# Patient Record
Sex: Female | Born: 1945
Health system: Southern US, Community
[De-identification: ages and names within clinical notes are randomized; demographics above are authoritative.]

## PROBLEM LIST (undated history)

## (undated) DIAGNOSIS — I10 Essential (primary) hypertension: Secondary | ICD-10-CM

## (undated) DIAGNOSIS — E785 Hyperlipidemia, unspecified: Secondary | ICD-10-CM

## (undated) DIAGNOSIS — R609 Edema, unspecified: Secondary | ICD-10-CM

## (undated) DIAGNOSIS — K921 Melena: Secondary | ICD-10-CM

## (undated) DIAGNOSIS — K625 Hemorrhage of anus and rectum: Secondary | ICD-10-CM

## (undated) DIAGNOSIS — IMO0001 Reserved for inherently not codable concepts without codable children: Secondary | ICD-10-CM

## (undated) DIAGNOSIS — J45909 Unspecified asthma, uncomplicated: Secondary | ICD-10-CM

## (undated) DIAGNOSIS — I509 Heart failure, unspecified: Secondary | ICD-10-CM

## (undated) DIAGNOSIS — R0602 Shortness of breath: Secondary | ICD-10-CM

## (undated) DIAGNOSIS — I5032 Chronic diastolic (congestive) heart failure: Secondary | ICD-10-CM

## (undated) DIAGNOSIS — I2510 Atherosclerotic heart disease of native coronary artery without angina pectoris: Secondary | ICD-10-CM

## (undated) DIAGNOSIS — E669 Obesity, unspecified: Secondary | ICD-10-CM

## (undated) DIAGNOSIS — M1990 Unspecified osteoarthritis, unspecified site: Secondary | ICD-10-CM

## (undated) DIAGNOSIS — Z794 Long term (current) use of insulin: Secondary | ICD-10-CM

## (undated) DIAGNOSIS — J449 Chronic obstructive pulmonary disease, unspecified: Secondary | ICD-10-CM

## (undated) DIAGNOSIS — K5909 Other constipation: Secondary | ICD-10-CM

## (undated) DIAGNOSIS — K219 Gastro-esophageal reflux disease without esophagitis: Secondary | ICD-10-CM

## (undated) DIAGNOSIS — G8929 Other chronic pain: Secondary | ICD-10-CM

## (undated) DIAGNOSIS — M109 Gout, unspecified: Secondary | ICD-10-CM

## (undated) DIAGNOSIS — Z9981 Dependence on supplemental oxygen: Secondary | ICD-10-CM

## (undated) DIAGNOSIS — M545 Low back pain: Secondary | ICD-10-CM

## (undated) DIAGNOSIS — G4733 Obstructive sleep apnea (adult) (pediatric): Secondary | ICD-10-CM

## (undated) DIAGNOSIS — Z8719 Personal history of other diseases of the digestive system: Secondary | ICD-10-CM

## (undated) DIAGNOSIS — E119 Type 2 diabetes mellitus without complications: Secondary | ICD-10-CM

## (undated) HISTORY — DX: Gastro-esophageal reflux disease without esophagitis: K21.9

## (undated) HISTORY — DX: Other constipation: K59.09

## (undated) HISTORY — DX: Reserved for inherently not codable concepts without codable children: IMO0001

## (undated) HISTORY — DX: Other chronic pain: G89.29

## (undated) HISTORY — PX: CHOLECYSTECTOMY: SHX55

## (undated) HISTORY — DX: Hemorrhage of anus and rectum: K62.5

## (undated) HISTORY — DX: Atherosclerotic heart disease of native coronary artery without angina pectoris: I25.10

## (undated) HISTORY — DX: Obstructive sleep apnea (adult) (pediatric): G47.33

## (undated) HISTORY — PX: TOTAL KNEE ARTHROPLASTY: SHX125

## (undated) HISTORY — DX: Melena: K92.1

## (undated) HISTORY — DX: Chronic obstructive pulmonary disease, unspecified: J44.9

## (undated) HISTORY — DX: Obesity, unspecified: E66.9

## (undated) HISTORY — DX: Low back pain: M54.5

## (undated) HISTORY — PX: VESICOVAGINAL FISTULA CLOSURE W/ TAH: SUR271

## (undated) HISTORY — DX: Long term (current) use of insulin: Z79.4

## (undated) HISTORY — DX: Type 2 diabetes mellitus without complications: E11.9

## (undated) HISTORY — DX: Hyperlipidemia, unspecified: E78.5

## (undated) HISTORY — DX: Chronic diastolic (congestive) heart failure: I50.32

## (undated) HISTORY — PX: TUBAL LIGATION: SHX77

## (undated) HISTORY — PX: CARDIAC CATHETERIZATION: SHX172

## (undated) HISTORY — DX: Edema, unspecified: R60.9

---

## 1999-09-30 ENCOUNTER — Inpatient Hospital Stay (HOSPITAL_COMMUNITY): Admission: EM | Admit: 1999-09-30 | Discharge: 1999-10-02 | Payer: Self-pay | Admitting: Cardiology

## 1999-10-01 ENCOUNTER — Encounter: Payer: Self-pay | Admitting: Cardiology

## 2004-01-25 HISTORY — PX: COLONOSCOPY: SHX174

## 2005-02-02 ENCOUNTER — Ambulatory Visit: Payer: Self-pay | Admitting: Internal Medicine

## 2005-09-03 ENCOUNTER — Ambulatory Visit: Payer: Self-pay | Admitting: Cardiology

## 2005-09-18 ENCOUNTER — Ambulatory Visit: Payer: Self-pay | Admitting: Internal Medicine

## 2005-09-28 ENCOUNTER — Ambulatory Visit: Payer: Self-pay | Admitting: Cardiology

## 2005-09-29 ENCOUNTER — Ambulatory Visit: Payer: Self-pay | Admitting: Internal Medicine

## 2006-02-01 ENCOUNTER — Ambulatory Visit: Payer: Self-pay | Admitting: Cardiology

## 2006-02-06 HISTORY — PX: COLONOSCOPY: SHX174

## 2006-02-22 ENCOUNTER — Ambulatory Visit: Payer: Self-pay | Admitting: Cardiology

## 2006-03-02 ENCOUNTER — Ambulatory Visit (HOSPITAL_COMMUNITY): Admission: RE | Admit: 2006-03-02 | Discharge: 2006-03-02 | Payer: Self-pay | Admitting: Cardiology

## 2006-03-02 ENCOUNTER — Ambulatory Visit: Payer: Self-pay | Admitting: Cardiology

## 2006-03-04 ENCOUNTER — Ambulatory Visit: Payer: Self-pay | Admitting: Cardiology

## 2006-03-11 ENCOUNTER — Ambulatory Visit: Payer: Self-pay | Admitting: Cardiology

## 2006-04-01 ENCOUNTER — Ambulatory Visit: Payer: Self-pay | Admitting: Internal Medicine

## 2006-05-04 ENCOUNTER — Ambulatory Visit: Payer: Self-pay | Admitting: Internal Medicine

## 2006-08-03 ENCOUNTER — Ambulatory Visit: Payer: Self-pay | Admitting: Cardiology

## 2006-08-13 ENCOUNTER — Ambulatory Visit: Payer: Self-pay | Admitting: Cardiology

## 2006-11-03 ENCOUNTER — Ambulatory Visit: Payer: Self-pay | Admitting: Internal Medicine

## 2006-11-17 ENCOUNTER — Ambulatory Visit (HOSPITAL_COMMUNITY): Admission: RE | Admit: 2006-11-17 | Discharge: 2006-11-17 | Payer: Self-pay | Admitting: Internal Medicine

## 2006-11-17 ENCOUNTER — Ambulatory Visit: Payer: Self-pay | Admitting: Internal Medicine

## 2006-11-17 HISTORY — PX: SIGMOIDOSCOPY: SUR1295

## 2007-03-01 ENCOUNTER — Ambulatory Visit: Payer: Self-pay | Admitting: Internal Medicine

## 2007-04-26 ENCOUNTER — Encounter: Payer: Self-pay | Admitting: Cardiology

## 2007-08-25 HISTORY — PX: UPPER GASTROINTESTINAL ENDOSCOPY: SHX188

## 2007-11-22 ENCOUNTER — Encounter: Payer: Self-pay | Admitting: Physician Assistant

## 2007-11-22 ENCOUNTER — Ambulatory Visit: Payer: Self-pay | Admitting: Cardiology

## 2007-11-23 ENCOUNTER — Encounter: Payer: Self-pay | Admitting: Cardiology

## 2007-11-24 HISTORY — PX: COLONOSCOPY: SHX174

## 2007-12-01 ENCOUNTER — Ambulatory Visit: Payer: Self-pay | Admitting: Cardiology

## 2007-12-01 ENCOUNTER — Encounter: Payer: Self-pay | Admitting: Physician Assistant

## 2007-12-27 ENCOUNTER — Ambulatory Visit: Payer: Self-pay | Admitting: Cardiology

## 2008-09-03 LAB — HEPATIC FUNCTION PANEL
ALT: 34 U/L (ref 7–35)
AST: 44 U/L — AB (ref 13–35)

## 2008-09-03 LAB — CBC AND DIFFERENTIAL: HCT: 36 % (ref 36–46)

## 2009-01-11 ENCOUNTER — Encounter: Payer: Self-pay | Admitting: Cardiology

## 2009-02-05 ENCOUNTER — Encounter: Payer: Self-pay | Admitting: Cardiology

## 2009-02-08 ENCOUNTER — Encounter: Payer: Self-pay | Admitting: Cardiology

## 2010-09-01 ENCOUNTER — Encounter: Payer: Self-pay | Admitting: Cardiology

## 2010-11-04 ENCOUNTER — Encounter: Payer: Self-pay | Admitting: Cardiology

## 2010-11-12 DIAGNOSIS — E039 Hypothyroidism, unspecified: Secondary | ICD-10-CM | POA: Insufficient documentation

## 2010-11-12 DIAGNOSIS — J441 Chronic obstructive pulmonary disease with (acute) exacerbation: Secondary | ICD-10-CM

## 2010-11-12 DIAGNOSIS — R072 Precordial pain: Secondary | ICD-10-CM

## 2010-11-12 DIAGNOSIS — R0602 Shortness of breath: Secondary | ICD-10-CM

## 2010-11-12 DIAGNOSIS — E119 Type 2 diabetes mellitus without complications: Secondary | ICD-10-CM

## 2010-11-12 DIAGNOSIS — M1990 Unspecified osteoarthritis, unspecified site: Secondary | ICD-10-CM | POA: Insufficient documentation

## 2010-11-12 DIAGNOSIS — I10 Essential (primary) hypertension: Secondary | ICD-10-CM

## 2010-11-12 DIAGNOSIS — D649 Anemia, unspecified: Secondary | ICD-10-CM | POA: Insufficient documentation

## 2010-11-13 ENCOUNTER — Ambulatory Visit: Payer: Self-pay | Admitting: Cardiology

## 2010-11-13 DIAGNOSIS — G4733 Obstructive sleep apnea (adult) (pediatric): Secondary | ICD-10-CM

## 2010-11-25 ENCOUNTER — Encounter: Payer: Self-pay | Admitting: Cardiology

## 2010-12-25 NOTE — Letter (Signed)
Summary: [facility] d/c Dr. Olena Leatherwood  [facility] d/c Dr. Olena Leatherwood   Imported By: Zachary George 11/12/2010 16:46:29  _____________________________________________________________________  External Attachment:    Type:   Image     Comment:   External Document

## 2010-12-25 NOTE — Letter (Signed)
Summary: External Correspondence/ FAXED REFERRAL [facility]  External Correspondence/ FAXED RE[facility]ERRAL [facility]   Imported By: Dorise Hiss 11/26/2010 09:34:10  _____________________________________________________________________  External Attachment:    Type:   Image     Comment:   External Document

## 2010-12-25 NOTE — Assessment & Plan Note (Signed)
Summary: 2 yr fu r/s 2nd no show   Visit Type:  Follow-up Primary Provider:  Lita Mains   History of Present Illness: the patient is a 65 year old female with a history of nonobstructive coronary artery disease by catheterization April 2007.  However the patient has sleep apnea and COPD.  She was recently seen in the emergency room.  She reported a cough and shortness of breath.  She has occasional is atypical chest pain.  However she has a history of estrogen reflux disease and esophageal dilatation.  The patient also has difficulty with her CPAP mask.  she also reports wheezing.blood pressure is elevated during this office visit  Preventive Screening-Counseling & Management  Alcohol-Tobacco     Smoking Status: never  Current Medications (verified): 1)  Levoxyl 125 Mcg Tabs (Levothyroxine Sodium) .... Take 1 Tablet By Mouth Once A Day 2)  Quinapril Hcl 40 Mg Tabs (Quinapril Hcl) .... Take 1 Tablet By Mouth Once A Day 3)  Humulin 70/30 70-30 % Susp (Insulin Isophane & Regular) .... Use As Directed 4)  Potassium Chloride Crys Cr 20 Meq Cr-Tabs (Potassium Chloride Crys Cr) .... Take 2 Tablet By Mouth Once A Day 5)  Etodolac 400 Mg Tabs (Etodolac) .... Take 1 Tablet By Mouth Two Times A Day 6)  Aspir-Low 81 Mg Tbec (Aspirin) .... Take 1 Tablet By Mouth Once A Day 7)  Colace 100 Mg Caps (Docusate Sodium) .... Take 1 Tablet By Mouth Two Times A Day 8)  Metoprolol Succinate 50 Mg Xr24h-Tab (Metoprolol Succinate) .... Take 1 Tablet By Mouth Once A Day 9)  Simvastatin 20 Mg Tabs (Simvastatin) .... Take 1 Tablet By Mouth Once A Day 10)  Albuterol Sulfate (2.5 Mg/78ml) 0.083% Nebu (Albuterol Sulfate) .... One Neb Tx Three Times A Day 11)  Advair Diskus 250-50 Mcg/dose Aepb (Fluticasone-Salmeterol) .... One Inhalation Two Times A Day 12)  Fish Oil 300 Mg Caps (Omega-3 Fatty Acids) .... Take 1 Tablet By Mouth Once A Day 13)  Omeprazole 20 Mg Cpdr (Omeprazole) .... Take 1 Tablet By Mouth Once A  Day 14)  Clindamycin Hcl 150 Mg Caps (Clindamycin Hcl) .... Take 1 Tablet By Mouth Four Times A Day 15)  Ferrous Sulfate 325 (65 Fe) Mg Tabs (Ferrous Sulfate) .... Take 1 Tablet By Mouth Once A Day 16)  Furosemide 20 Mg Tabs (Furosemide) .... Take 1 Tablet By Mouth Once A Day 17)  Spiriva Handihaler 18 Mcg Caps (Tiotropium Bromide Monohydrate) .... Inhale One Cap Daily 18)  Amlodipine Besylate 5 Mg Tabs (Amlodipine Besylate) .... Take 1 Tablet By Mouth Once A Day 19)  Isosorbide Mononitrate Cr 60 Mg Xr24h-Tab (Isosorbide Mononitrate) .... Take 1 1/2 Tabs (90mg ) Two Times A Day  Allergies (verified): 1)  ! Tetracycline 2)  ! Morphine  Comments:  Nurse/Medical Assistant: The patient's medication list and allergies were reviewed with the patient and were updated in the Medication and Allergy Lists.  Past History:  Past Medical History: Last updated: 11/12/2010  1. Nonobstructive coronary artery disease.       a.     Cardiac catheterization in April, 2007.       b.     Preserved left ventricular function with no wall motion        abnormalities.       c.     Question coronary vasospasm by cardiac catheterization in        2000.   2. Morbid obesity.   3. Chronic lower extremity edema.  4. Obstructive sleep apnea.       a.     Currently tolerating CPAP.   5. Chronic obstructive pulmonary disease.       a.     Secondary to second-hand tobacco smoking.       b.     Continuous home oxygen.   6. Insulin-dependent diabetes mellitus.   7. Dyslipidemia.       a.     Tolerating Crestor.   8. Chronic lower back pain.   9. Gastroesophageal reflux disease.      Past Surgical History: Last updated: 11/24/10 Cardiac Catheterization Cholecystectomy knee replacement Hysterectomy with bilateral tubal ligation and ovarian cyst removal  Family History: Last updated: November 24, 2010 Father: deceased had kidney failure Mother: decesed with meningitis Siblings: breast lumps  Social  History: Last updated: November 24, 2010 Retired  Married  Alcohol Use - no Regular Exercise - no Drug Use - no  Risk Factors: Smoking Status: never (11/13/2010)  Social History: Smoking Status:  never  Review of Systems       The patient complains of chest pain, shortness of breath, muscle weakness, and leg swelling.  The patient denies fatigue, malaise, fever, weight gain/loss, vision loss, decreased hearing, hoarseness, palpitations, prolonged cough, wheezing, sleep apnea, coughing up blood, abdominal pain, blood in stool, nausea, vomiting, diarrhea, heartburn, incontinence, blood in urine, joint pain, rash, skin lesions, headache, fainting, dizziness, depression, anxiety, enlarged lymph nodes, easy bruising or bleeding, and environmental allergies.    Vital Signs:  Patient profile:   65 year old female Height:      61 inches Weight:      299 pounds BMI:     56.70 Pulse rate:   85 / minute BP sitting:   156 / 83  (left arm) Cuff size:   large  Vitals Entered By: Carlye Grippe (November 13, 2010 10:28 AM)  Nutrition Counseling: Patient's BMI is greater than 25 and therefore counseled on weight management options.  O2 Flow:  5 L/min via [place]  Physical Exam  Additional Exam:  General: overweight female head: Normocephalic and atraumatic eyes PERRLA/EOMI intact, conjunctiva and lids normal nose: No deformity or lesions mouth normal dentition, normal posterior pharynx neck: Supple, no JVD.  No masses, thyromegaly or abnormal cervical nodes lungs: decreased breath sounds bilaterally with wheezing heart: regular rate and rhythm with normal S1 and S2, no S3 or S4.  PMI is normal.  No pathological murmurs abdomen: Normal bowel sounds, abdomen is soft and nontender without masses, organomegaly or hernias noted.  No hepatosplenomegaly musculoskeletal: Back normal, normal gait muscle strength and tone normal pulsus: Pulse is normal in all 4 extremities Extremities: No peripheral pitting  edema neurologic: Alert and oriented x 3 skin: Intact without lesions or rashes cervical nodes: No significant adenopathy psychologic: Normal affect    Impression & Recommendations:  Problem # 1:  SHORTNESS OF BREATH (ICD-786.05) no evidence of coronary artery disease.  The patient has severe COPD and is on 5 L O2 nasal cannula.  I have recommended Spiriva inhaler. Her updated medication list for this problem includes:    Quinapril Hcl 40 Mg Tabs (Quinapril hcl) .Marland Kitchen... Take 1 tablet by mouth once a day    Aspir-low 81 Mg Tbec (Aspirin) .Marland Kitchen... Take 1 tablet by mouth once a day    Metoprolol Succinate 50 Mg Xr24h-tab (Metoprolol succinate) .Marland Kitchen... Take 1 tablet by mouth once a day    Furosemide 20 Mg Tabs (Furosemide) .Marland Kitchen... Take 1 tablet by mouth once a day  Amlodipine Besylate 5 Mg Tabs (Amlodipine besylate) .Marland Kitchen... Take 1 tablet by mouth once a day  Problem # 2:  OBSTRUCTIVE CHRONIC BRONCHITIS WITH EXACERBATION (ICD-491.21) severe on 5-L O2 nasal cannula Her updated medication list for this problem includes:    Albuterol Sulfate (2.5 Mg/30ml) 0.083% Nebu (Albuterol sulfate) ..... One neb tx three times a day    Advair Diskus 250-50 Mcg/dose Aepb (Fluticasone-salmeterol) ..... One inhalation two times a day    Clindamycin Hcl 150 Mg Caps (Clindamycin hcl) .Marland Kitchen... Take 1 tablet by mouth four times a day    Spiriva Handihaler 18 Mcg Caps (Tiotropium bromide monohydrate) ..... Inhale one cap daily  Problem # 3:  PRECORDIAL PAIN (ICD-786.51) the patient does not need an ischemia workup.  Her chest pain may be from reflux or esophageal spasm.  She has a history of vestigial dilatation.  Given the fact that she also has hypertension I increased her isosorbide to 90 mg twice a day and added amlodipine 5 mg a day. Her updated medication list for this problem includes:    Quinapril Hcl 40 Mg Tabs (Quinapril hcl) .Marland Kitchen... Take 1 tablet by mouth once a day    Aspir-low 81 Mg Tbec (Aspirin) .Marland Kitchen... Take 1  tablet by mouth once a day    Metoprolol Succinate 50 Mg Xr24h-tab (Metoprolol succinate) .Marland Kitchen... Take 1 tablet by mouth once a day    Amlodipine Besylate 5 Mg Tabs (Amlodipine besylate) .Marland Kitchen... Take 1 tablet by mouth once a day    Isosorbide Mononitrate Cr 60 Mg Xr24h-tab (Isosorbide mononitrate) .Marland Kitchen... Take 1 1/2 tabs (90mg ) two times a day  Problem # 4:  OBSTRUCTIVE SLEEP APNEA (ICD-327.23) I've referred the patient to neurology for adjustment of her CPAP mask and adjustment of pressures.  Patient Instructions: 1)  Spiriva Handihaler daily 2)  Referral to Dr. Ninetta Lights for adjustment of c-pap 3)  Amlodipine 5mg  daily 4)  Increase Imdur to 90mg  two times a day  5)  Follow up in  6 months Prescriptions: ISOSORBIDE MONONITRATE CR 60 MG XR24H-TAB (ISOSORBIDE MONONITRATE) take 1 1/2 tabs (90mg ) two times a day  #90 x 6   Entered by:   Hoover Brunette, LPN   Authorized by:   Lewayne Bunting, MD, [facility]   Signed by:   Hoover Brunette, LPN on 04/54/0981   Method used:   Electronically to        [facility]* (retail)       [address]       [place], [place]  [address]       Ph: [phone]       Fax: [phone]   RxID:   [id number] AMLODIPINE BESYLATE 5 MG TABS (AMLODIPINE BESYLATE) Take 1 tablet by mouth once a day  #30 x 6   Entered by:   Hoover Brunette, LPN   Authorized by:   Lewayne Bunting, MD, [facility]   Signed by:   Hoover Brunette, LPN on 36/64/4034   Method used:   Electronically to        [facility]* (retail)       [address]       [place], [place]  [address]       Ph: [phone]       Fax: [phone]   RxID:   [id number] SPIRIVA HANDIHALER 18 MCG CAPS (TIOTROPIUM BROMIDE MONOHYDRATE) inhale one cap daily  #30 x 6   Entered  by:   Hoover Brunette, LPN   Authorized by:   Lewayne Bunting, MD, [facility]   Signed by:   Hoover Brunette, LPN on 09/16/8526   Method used:   Electronically to        [facility]* (retail)       [address]       [place], [place]   [address]       Ph: [phone]       Fax: [phone]   RxID:   [id number]

## 2010-12-25 NOTE — Miscellaneous (Signed)
Summary: Orders Update - Neurology  Clinical Lists Changes  Orders: Added new Referral order of Neurology Referral (Neuro) - Signed 

## 2010-12-25 NOTE — Medication Information (Signed)
Summary: [facility] INPT OF 09-01-2010  [facility] OF 09-01-2010   Imported By: Zachary George 11/12/2010 16:56:19  _____________________________________________________________________  External Attachment:    Type:   Image     Comment:   External Document

## 2011-04-07 NOTE — Assessment & Plan Note (Signed)
[facility]                          [facility] OFFICE NOTE   Rachel Nguyen, Rachel Nguyen                     MRN:          [id number]  DATE:11/22/2007                            DOB:          [birth year]    PRIMARY CARDIOLOGIST:  Learta Codding, MD,FACC.   REASON FOR VISIT:  Annual followup.   Patient returns to the clinic for annual followup, since last seen here  by me in September, 2007.  She has a complex past medical history but  nonobstructive coronary artery disease by coronary catheterization in  April, 2007.  When we last saw her, we checked a fasting lipid profile  and patient was subsequently started on Crestor 20 daily.  She reports  today that she had been tolerating this quite nicely and only recently  ran out.   Patient was also recently treated here in the emergency room on December  21 with upper respiratory symptoms.  Her records suggest admitting  symptoms of recurrent vomiting and diarrhea, but also with chest pain.  She presented with a blood pressure of 179/90.  She was treated with a  single nebulizer treatment with improvement and complete resolution of  her respiratory symptoms.  Of note, a chest x-ray was not obtained.   Patient reports today that her symptoms continued to remain stable.  She  denies any exertional angina pectoris.  Whereas in the past she was not  able to tolerate CPAP, she currently is doing so; however, she is also  having significant reflux and is on a proton pump inhibitor.   Of note, the patient presents with a weight gain of approximately 33  pounds, compared to her last office visit.  She is quite dysmobile  secondary to morbid obesity and does report diminished activity in the  recent past.  She also suggests worsening lower extremity edema.   Electrocardiogram today reveals NSR at 83 beats per minute with  borderline left axis deviation and poor R wave progression, somewhat  more pronounced  than indicated on her previous office EKG.   CURRENT MEDICATIONS:  1. Torsemide 20 b.i.d.  2. Potassium 20 b.i.d.  3. Crestor 20 daily.  4. Quinapril 40 daily.  5. Loratadine 10 daily.  6. Metoprolol 50 daily.  7. Isosorbide 60 b.i.d.  8. Omeprazole 20 b.i.d.  9. Aspirin 81 daily.  10.Levothyroxine 0.112 mg daily.  11.Ferrous gluconate 5 mg b.i.d.  12.Advair 50/250 daily.  13.Albuterol nebulizer.  14.NovoLog insulin as directed.  15.Continue his oxygen (2 liters).   PHYSICAL EXAMINATION:  Blood pressure 155/82, pulse 88 and regular,  weight 290 (up 33 pounds).  Sats 96% 2 liters.  GENERAL:  A 65 year old female, morbidly obese, sitting upright and in  no distress.  HEENT:  Normocephalic and atraumatic.  NECK:  Palpable carotid pulses without bruits.  Unable to assess JVD  secondary to neck girth.  LUNGS:  Diminished breath sounds in the bases but without crackles or  wheezes.  HEART:  Regular rate and rhythm (S1 and S2).  Positive S4.  A soft grade  2/6 systolic ejection murmur at the base.  No diastolic blow.  ABDOMEN:  Protuberant and nontender.  EXTREMITIES:  Chronic 3+ bilateral lower extremity edema.  Nonpalpable  dorsalis pedis pulses.  NEURO:  No focal deficits.   IMPRESSION:  1. Nonobstructive coronary artery disease.      a.     Cardiac catheterization in April, 2007.      b.     Preserved left ventricular function with no wall motion       abnormalities.      c.     Question coronary vasospasm by cardiac catheterization in       2000.  2. Morbid obesity.  3. Chronic lower extremity edema.  4. Obstructive sleep apnea.      a.     Currently tolerating CPAP.  5. Chronic obstructive pulmonary disease.      a.     Secondary to second-hand tobacco smoking.      b.     Continuous home oxygen.  6. Insulin-dependent diabetes mellitus.  7. Dyslipidemia.      a.     Tolerating Crestor.  8. Chronic lower back pain.  9. Gastroesophageal reflux disease.   PLAN:  1.  A 2D echocardiogram for reassessment of left ventricular function,      particularly in light of significant weight gain and worsening      lower extremity edema.  2. Aggressive blood pressure control with the addition of clonidine      0.1 mg b.i.d.  I entertained the notion of adding Norvasc, but      declined to do so in light of her lower extremity edema.  3. Two view chest x-ray and BNP level for assessment of possible      chronic heart failure.  4. Add Zaroxylan 2.5 mg every Monday/Wednesday/Friday for aggressive      diuresis of lower extremity edema.  We will check a follow-up BMET      in one week, following a baseline complete metabolic profile today.  5. Renew prescription for Crestor at previous dose of 20 mg daily.      Check fasting lipids/liver profile.  6. Schedule early clinic followup with myself and Dr. Andee Lineman in one      month for review of study results and further recommendations.      Gene Serpe, PA-C  Electronically Signed      Learta Codding, MD,FACC  Electronically Signed   GS/MedQ  DD: 11/22/2007  DT: 11/23/2007  Job #: [id number]   cc:   Lia Hopping

## 2011-04-07 NOTE — Assessment & Plan Note (Signed)
[facility]                          [facility] OFFICE NOTE   FELISSA, BLOUCH                     MRN:          [id number]  DATE:12/27/2007                            DOB:          05/17/1946    PRIMARY CARDIOLOGIST:  Learta Codding, MD.   REASON FOR VISIT:  Scheduled follow-up.  Please refer to my previous  office note of December 30, for full details.   At that time, we were concerned that Ms. Nippert had gained over 30  pounds since her previous visit and also suggested possible worsening  lower extremity edema.  I thus reassessed her LV function with a repeat  2-D echocardiogram to rule out any worsening in function.  However, this  yielded continued preserved LVEF (60-65%) with a suggestion of diastolic  dysfunction.  There was no significant valvular disease.   I also recommended aggressive blood pressure control and added clonidine  0.1 mg b.i.d.  I did not recommend amlodipine, in light of her chronic  lower extremity edema.  I also added Zaroxolyn 2.5 mg times a week for  more aggressive treatment of her chronic edema.   Blood work was also checked, including a BNP level which was negative at  47.  Potassium was 3.3 on January 13 with a creatinine of 1.5 and a BUN  of 23.  This does suggest some exacerbation of her renal function, with  a creatinine of 0.8 on December 31.   A chest x-ray was also done suggesting no evidence of congestive heart  failure or pneumonia.  The patient informs me today that she was just  treated for sinusitis in the emergency room this past Wednesday, by Dr.  Olena Leatherwood.  She is being treated with sulfa, but does not feel that her  symptoms have improved to any significant extent.   The patient ran out of her clonidine and Zaroxolyn yesterday.  She is  awaiting resupply of these, and apparently some other medications, later  today.  She seemed to be suggesting that she is on Procardia, but I  have no  record that she is on this medication.  She states that she has  been on this for quite some time.   CURRENT MEDICATIONS:  1. Continuous oxygen (2 L).  2. Torsemide 20 mg b.i.d.  3. Potassium 20 mg b.i.d.  4. Crestor 20 mg daily.  5. Quinapril 40 mg daily.  6. Metoprolol 50 mg daily.  7. Isosorbide 60 mg b.i.d.  8. Omeprazole 20 mg b.i.d.  9. Aspirin 81 mg daily.  10.Levothyroxine 0.112 mg daily.  11.Ferrous gluconate 5 mg b.i.d.  12.Advair, albuterol nebulizer.  13.Insulin as directed.  14.Clonidine 0.1 mg b.i.d.  15.Zaroxolyn 2.5 mg every Monday, Wednesday, Friday.  16.CPAP.   PHYSICAL EXAM:  Blood pressure 178/87, pulse 104, regular weight 289.8  (down 1 pound), saturation 95% on room air.  GENERAL:  A 65 year old female, morbidly obese, sitting upright and in  no distress.  HEENT:  Normocephalic, atraumatic, with nasal cannula.  LUNGS:  Diminished breath sounds at bases but without crackles or  wheezes.  HEART:  Regular rate and rhythm (S1, S2), positive S4.  A soft grade 2/6  systolic ejection murmur at the base.  No diastolic blow.  ABDOMEN:  Markedly protuberant, nontender.  EXTREMITIES:  Chronic 3+ bilateral lower extremity edema.  NEUROLOGIC:  No focal deficit.   IMPRESSION:  1. Nonobstructive coronary artery disease.      a.     By cardiac catheterization, April 2007.      b.     Continued preserved left ventricular function (60-65%), by       current 2-D echo.      c.     Diastolic dysfunction.  2. Chronic lower extremity edema.  3. Hypertension.  4. Morbid obesity.  5. Obstructive sleep apnea/on CPAP.  6. Chronic obstructive pulmonary disease.      a.     Home oxygen-dependent.      b.     Secondary to secondhand tobacco smoking.  7. Insulin-dependent diabetes mellitus.  8. Dyslipidemia.      a.     Tolerating Crestor.      b.     Recent lipid profile:  Total cholesterol 217, triglyceride       241, HDL 44 and LDL 125.  9. Gastroesophageal reflux  disease.  10.Chronic lower back pain.   PLAN:  1. Continue current medication regimen, as previously outlined.  2. Follow-up BMET for monitoring of renal function and hypokalemia.  3. The patient will be referred back to Dr. Olena Leatherwood for clinic follow-      up and reassessment of her persistent sinusitis.  4. We will request verification from [facility] with respect to the      patient's medications, to ensure that we have an accurate list.  5. Schedule return clinic follow-up with myself and Dr. Andee Lineman again      in 1 year, or sooner as needed.      Gene Serpe, PA-C  Electronically Signed      Learta Codding, MD,FACC  Electronically Signed   GS/MedQ  DD: 12/27/2007  DT: 12/28/2007  Job #: [id number]   cc:   Lia Hopping

## 2011-04-10 NOTE — Cardiovascular Report (Signed)
[place]. [facility]  Patient:    Rachel Nguyen                        MRN: [id number] Proc. Date: 09/30/99 Adm. Date:  [id number] Attending:  Learta Codding CC:         Quitman Livings, M.D.             Lewayne Bunting, M.D.                        Cardiac Catheterization  PRIMARY PHYSICIAN:  Quitman Livings, M.D.  CARDIOLOGIST:  Lewayne Bunting, M.D.  PROCEDURE:  Left heart cardiac catheterization, coronary arteriography.  INDICATIONS:  Evaluate patient with unstable angina.  DESCRIPTION OF PROCEDURE:  Left heart catheterization was performed via the right femoral artery.  The artery was cannulated using an anterior wall puncture.  A 6 French arterial sheath was inserted via the Seldinger technique.  Preformed Judkins and pigtail catheters were utilized.  The patient tolerated the procedure well nd left the lab in stable condition.  RESULTS:  HEMODYNAMICS:  LV 158/23, AO 158/85.  CORONARY ARTERIOGRAPHY:  Left main:  The left main coronary artery was normal.  Left anterior descending:  The LAD had mid 25% stenosis.  The mid diagonal had ostial 25% stenosis.  Circumflex:  The circumflex coronary artery was normal.  Right coronary artery:  The right coronary artery was large and dominant with luminal irregularities.  LEFT VENTRICULOGRAM:  The left ventriculogram was obtained in the RAO projection. The EF was 65%.  CONCLUSION:  Nonobstructive coronary artery disease.  PLAN:  Medical management and continued risk factor modification.  Should the patient have continued discomfort, a non-anginal etiology will be investigated.  DD:  09/30/99 TD:  10/01/99 Job: [id number]

## 2011-04-10 NOTE — Discharge Summary (Signed)
[place]. [facility]  Patient:    Rachel Nguyen                        MRN: [id number] Adm. Date:  [id number] Disc. Date: 10/02/99 Attending:  Learta Codding Dictator:   Abelino Derrick, P.A.C. [facility] CC:         Rollene Rotunda, M.D. [facility] office             [facility], [phone], [place], [place].             Dr. Lia Hopping, c/o [facility], [place], [place]. [address]                  Referring Physician Discharge [place]  DISCHARGE DIAGNOSES: 1. Chest pain, noncardiac in origin, with nonobstructive coronary disease, this    admission. 2. Insulin-dependent diabetes. 3. History of asthma. 4. Obesity.  HOSPITAL COURSE:  Patient is a 65 year old female with hypertension, hyperlipidemia, and diabetes.  She had an adenosine Cardiolite at [facility], and had chest pressure and EKG changes.  She was seen urgently by Dr. Andee Lineman.  She was transferred to [facility] for further evaluation.  Cardiac catheterization was done on September 30, 1999 which revealed a normal left main, 25% LAD, normal circumflex,  normal RCA, with an EF of 65%.  She tolerated this well.  She underwent a spiral CT scan October 01, 1999 which was negative for pulmonary embolism.  Dr. Antoine Poche feels she can be discharged November 9.  She will follow up with Dr. Olena Leatherwood.  DISCHARGE MEDICATIONS: 1. Insulin 70/30 50 units in the morning, 60 units in the evening. 2. Adalat 60 mg a day. 3. Accupril 40 mg a day. 4. Prevacid 30 mg a day. 5. Demadex 20 mg twice a day. 6. Lodine as taken at home.  LABORATORY:  Hematology shows a white count of 6.8, hemoglobin 11.8, hematocrit  33.1, platelets 238.  Sodium 135, potassium 2.6, BUN 10, creatinine 0.6.  DISPOSITION:  Patient discharged in stable condition.  FOLLOW-UP:  Will follow up with Dr. Olena Leatherwood. DD:  10/02/99 TD:  10/02/99 Job: [id number]

## 2011-04-10 NOTE — Op Note (Signed)
NAME:  Rachel Nguyen, Rachel Nguyen              ACCOUNT NO.:  [id number]   MEDICAL RECORD NO.:  [id number]          PATIENT TYPE:  AMB   LOCATION:  DAY                           FACILITY:  [facility]   PHYSICIAN:  Lionel December, M.D.    DATE OF BIRTH:  [birth year]   DATE OF PROCEDURE:  DATE OF DISCHARGE:                               OPERATIVE REPORT   PROCEDURE:  Flexible sigmoidoscopy.   INDICATIONS:  Rachel Nguyen is a 65 year old Caucasian female with multiple  medical problems who has recurrent/intermittent hematochezia sometimes  occurring once or twice a week.  She had colonoscopy earlier in the year  at [facility] which was normal other than diverticulosis.  Since her brother  died of metastatic colon carcinoma, she remains very concerned about  hematochezia and therefore undergoing flexible sigmoidoscopy to make  sure nothing was missed in the rectosigmoid area.  Procedures were  reviewed the patient, informed consent was obtained.   Meds for conscious sedation, Demerol 50 mg IV Versed 5 mg IV.   FINDINGS:  Procedure performed in endoscopy suite.  The patient's vital  signs and O2 sat were monitored during procedure and remained stable.  The patient was given sedation per request.  Rectal examination  performed.  She had small sentinel skin tags.  Digital exam was  unremarkable.  Pentax videoscope was placed in the rectum and advanced  under vision into sigmoid colon where she had large amount of formed  stool.  I could not pass the scope any further.  Scope was therefore  withdrawn.  The patient was given one Fleet enema and examination  repeated.  This time I was able to pass the scope a little bit further  but she still had large amount of formed stool.  Therefore examination  was limited to 40 cm.  Mucosa of the distal sigmoid colon was normal.  Rectal mucosa similarly was normal.  Scope was retroflexed to examine  the anorectal junction where there was scarring.  No other abnormality  was noted.   Endoscope was then withdrawn.  The patient tolerated the  procedure well.   FINAL DIAGNOSIS:  Limited flexible sigmoidoscopy which was normal other  than anorectal scarring from prior hemorrhoidectomy.   Examination limited because of constipation.   Examination limited because of large amount of formed stool despite prep  and had a Fleet's enema here.   RECOMMENDATIONS:  1. Fiber supplement 4 grams daily.  2. Colace.  3. Peri-Colace one twice daily.  4. Keep symptom diary and return for OV in 3 months.      Lionel December, M.D.  Electronically Signed     NR/MEDQ  D:  11/17/2006  T:  11/17/2006  Job:  [id number]   cc:   Charlynn Grimes, MD

## 2011-04-10 NOTE — Assessment & Plan Note (Signed)
[facility]                            [facility] OFFICE NOTE   Rachel Nguyen, Rachel Nguyen                     MRN:          [id number]  DATE:08/03/2006                            DOB:          1946/03/14    Primary cardiologist is Dr. Andee Lineman.   REASON FOR VISIT:  Scheduled 8-month followup.   Rachel Nguyen is a pleasant 65 year old female, with documented  nonobstructive coronary artery disease with a question of possible coronary  vasospasm by initial catheterization in 2000, who now presents for scheduled  followup.  She was last seen here in the office in April of this year by  Rachel Felts, PA-C, for post-catheterization followup.  Her catheterization  revealed nonobstructive CAD, notable for a 50% ostial second diagonal  lesion, 30% first obtuse marginal disease, and 30% left anterior descending  artery stenosis.  Left ventricular function was well preserved with no  definite wall motion abnormalities.   Cardiac medications were not adjusted at that time.   In the interim, the patient reports no significant change from her baseline.  Of note, she continues to have occasional episodes of chest discomfort, but  this is predominantly at rest, given that she is extremely limited in  mobility secondary to morbid obesity and significant arthritis.  In fact,  she states that these chest pain episodes typically occur when she is  experiencing stress, but can occur at times when she is walking.  However,  there has been no increase in either frequency or intensity since last seen.   The patient has never smoked tobacco, but was exposed to secondhand smoke,  and thus has been diagnosed with COPD.  She is on continuous oxygen.   CURRENT MEDICATIONS:  1. Imdur 60 mg b.i.d.  2. Torsemide 20 mg daily.  3. Premarin 1.25 mg daily.  4. Etodolac ER 600 mg b.i.d.  5. Potassium chloride 20 mEq daily.  6. __________ 60 daily.  7. Nifedipine ER 60 mg  daily.  8. Zyrtec 120 mg daily.  9. Quinapril 4 mg daily.  10.Simvastatin 20 mg daily.  11.Zelnorm 60 mg daily.  12.Ferrous sulfate 5 mg b.i.d.  13.Stool softener.  14.Humulin 70/30 60 units q.a.m.  15.Albuterol nebulizer 6 times a day.  16.Aspirin 81 mg daily.  17.Levoxyl.   PHYSICAL EXAMINATION:  Blood pressure 122/74, pulse 64, regular.  Weight 267  (up 1 pound).  NECK:  Palpable carotid pulses without bruits; unable to assess JVD  secondary to neck girth.  LUNGS:  Clear to auscultation all fields.  HEART:  Regular rate and rhythm (S1, S2), positive S4.  No significant  murmurs.  ABDOMEN:  Protuberant, nontender.  EXTREMITIES:  Chronic bilateral lower extremity edema, nonpitting, with  nonpalpable dorsalis pedis pulses.  NEUROLOGIC:  No focal deficit.   IMPRESSION:  1. Nonobstructive coronary artery disease.      a.     Status post cardiac catheterization April 2007.      b.     Question coronary vasospasm by catheterization November 2000.      c.     Normal left ventricular function.  2. Morbid obesity.  3. Obstructive sleep apnea.      a.     Intolerant of CPAP.  4. Chronic obstructive pulmonary disease.      a.     Secondary to secondhand tobacco smoking.      b.     Continuous home oxygen.  5. Insulin-requiring diabetes mellitus.  6. Hyperlipidemia.  7. Hypertension - well controlled.  8. Chronic bilateral lower extremity edema.  9. Chronic lower back pain.      a.     History of spondylosis.   PLAN:  Continue current medication regimen.  We will check a fasting lipid  profile for surveillance of her lipid profile and with recommendation to  maintain an LDL of 70 or less.  We will otherwise plan on having her return  in followup in one year with Dr. Lewayne Bunting.                                   Gene Serpe, PA-C                                Learta Codding, MD,FACC   GS/MedQ  DD:  08/03/2006  DT:  08/04/2006  Job #:  [id number]   cc:   Lia Hopping

## 2011-04-10 NOTE — Cardiovascular Report (Signed)
Rachel Nguyen, Rachel Nguyen NO.:  [id number]   MEDICAL RECORD NO.:  [id number]          PATIENT TYPE:  OIB   LOCATION:  2899                         FACILITY:  [facility]   PHYSICIAN:  Arturo Morton. Riley Kill, M.D. [facility] OF BIRTH:  [birth year]   DATE OF PROCEDURE:  03/02/2006  DATE OF DISCHARGE:                              CARDIAC CATHETERIZATION   INDICATIONS:  Rachel Nguyen is a 65 year old who has had recurrent episodes  of substernal chest discomfort.  The discomfort has some typical and  atypical features.  She has apparently had upper endoscopy done.  She is on  a proton pump inhibitor.   She was seen by Dr. Andee Lineman and Dr. Arnette Felts and subsequently referred for  cardiac catheterization.   PROCEDURE:  1.  Left heart catheterization  2.  Selective coronary arteriography.  3.  Selective left ventriculography.   DESCRIPTION OF PROCEDURE:  The patient was brought to the cath lab and  prepped and draped in the usual fashion.  Through an anterior puncture, the  right femoral artery was easily entered.  A 6-French sheath was placed.  Views of the left and right coronaries were obtained in multiple  angiographic projections.  Central aorta and left ventricular pressures were  measured with the pigtail.  Ventriculography was then performed in the RAO  projection.  Following this, catheters were all removed.  I reviewed the  films with her husband.  We also obtained an arterial blood gas and a D-  dimer.  I spoke with Dr. Andee Lineman by phone.  She was taken to the holding area  in satisfactory clinical condition.   HEMODYNAMIC DATA:  1.  Central aortic pressure 155/88, mean 117.  2.  Left ventricular pressure 156/20.  3.  No __________ across aortic valve.   ANGIOGRAPHIC DATA:  1.  Ventriculography was done in the RAO projection.  Overall systolic      function appears preserved.  There is no definite wall motion      abnormality.  2.  The right coronary artery is a  large-caliber vessel.  There is calcium      at the ostium.  The right coronary appears to be at least 4-5 mm in      size.  It provides a large posterior descending and large posterolateral      branch.  There is some tapering of these vessels distally, but they are      without critical narrowing.  3.  The left main coronary artery is without significant focal obstruction.  4.  The left anterior descending artery courses to the apex.  There is a      small first diagonal branch without significant focal narrowing.  The      second diagonal branch is a fairly large-caliber vessel that comes off      just after the septal perforator.  This diagonal has 40 to no more than      50% segmental plaquing at its ostium.  The distal vessel was relatively      large.  The LAD just after the second diagonal has about  a 30% area of      focal narrowing.  The vessel tapers distally and courses to the apex.      The large septal perforator is without critical obstruction.  5.  The circumflex vessel provides a first marginal branch and a second      large marginal branch.  The first marginal maybe had about 20-30%      narrowing at the first marginal branch near the ostium.  The remainder      the vessel was without critical disease.   CONCLUSIONS:  1.  Well-preserved left ventricular function.  2.  No evidence of high-grade focal obstruction.   ADDENDUM:  There is a mild 30% plaquing in the continuation branch of the  distal right coronary artery.   DISPOSITION:  I have spoken with Dr. Andee Lineman.  We will get a blood gas and a  D-dimer.  The patient will see Dr. Andee Lineman back in follow-up next week  and  further decisions made with regard to further treatment.      Arturo Morton. Riley Kill, M.D. [facility]  Electronically Signed     TDS/MEDQ  D:  03/02/2006  T:  03/02/2006  Job:  [id number]   cc:   Learta Codding, M.D. [facility]  [address]  [address]  [place]  [place] [address]   Suszanne Conners. Duran, P.A.  [address]  [place]  [place] [address]   CV Laboratory   Lionel December, M.D.  [address]  [place]  [place] [address]

## 2011-05-01 ENCOUNTER — Encounter: Payer: Self-pay | Admitting: Cardiology

## 2011-05-05 ENCOUNTER — Ambulatory Visit: Payer: Self-pay | Admitting: Cardiology

## 2011-06-30 ENCOUNTER — Other Ambulatory Visit: Payer: Self-pay | Admitting: *Deleted

## 2011-06-30 MED ORDER — AMLODIPINE BESYLATE 5 MG PO TABS: 5.0000 mg | ORAL_TABLET | Freq: Every day | ORAL | Status: DC

## 2011-07-02 ENCOUNTER — Encounter (INDEPENDENT_AMBULATORY_CARE_PROVIDER_SITE_OTHER): Payer: Self-pay

## 2011-07-06 ENCOUNTER — Encounter (INDEPENDENT_AMBULATORY_CARE_PROVIDER_SITE_OTHER): Payer: Self-pay

## 2011-07-07 ENCOUNTER — Encounter (INDEPENDENT_AMBULATORY_CARE_PROVIDER_SITE_OTHER): Payer: Self-pay

## 2011-07-14 ENCOUNTER — Ambulatory Visit (INDEPENDENT_AMBULATORY_CARE_PROVIDER_SITE_OTHER): Payer: Self-pay | Admitting: Internal Medicine

## 2011-07-17 ENCOUNTER — Ambulatory Visit: Payer: Self-pay | Admitting: Cardiology

## 2011-08-24 ENCOUNTER — Ambulatory Visit (INDEPENDENT_AMBULATORY_CARE_PROVIDER_SITE_OTHER): Payer: Self-pay | Admitting: Internal Medicine

## 2011-09-14 ENCOUNTER — Ambulatory Visit: Payer: Self-pay | Admitting: Cardiology

## 2011-09-21 ENCOUNTER — Ambulatory Visit (INDEPENDENT_AMBULATORY_CARE_PROVIDER_SITE_OTHER): Payer: Self-pay | Admitting: Internal Medicine

## 2011-09-27 ENCOUNTER — Telehealth: Payer: Self-pay | Admitting: Nurse Practitioner

## 2011-09-27 NOTE — Telephone Encounter (Signed)
Pt called in this afternoon stating that she was just d/c from [facility] hospital following admission for c/p and was wondering if i could review her admission.  i informed her that i don't have access to [facility] hospital records but the weekday team @ [facility] would if she would like to call back tomorrow.

## 2011-12-15 ENCOUNTER — Other Ambulatory Visit: Payer: Self-pay | Admitting: Cardiology

## 2011-12-29 DIAGNOSIS — K921 Melena: Secondary | ICD-10-CM | POA: Diagnosis not present

## 2011-12-29 DIAGNOSIS — J449 Chronic obstructive pulmonary disease, unspecified: Secondary | ICD-10-CM | POA: Diagnosis not present

## 2011-12-29 DIAGNOSIS — J019 Acute sinusitis, unspecified: Secondary | ICD-10-CM | POA: Diagnosis not present

## 2011-12-29 DIAGNOSIS — J209 Acute bronchitis, unspecified: Secondary | ICD-10-CM | POA: Diagnosis not present

## 2011-12-29 DIAGNOSIS — J45909 Unspecified asthma, uncomplicated: Secondary | ICD-10-CM | POA: Diagnosis not present

## 2012-01-14 DIAGNOSIS — J45909 Unspecified asthma, uncomplicated: Secondary | ICD-10-CM | POA: Diagnosis not present

## 2012-01-14 DIAGNOSIS — J209 Acute bronchitis, unspecified: Secondary | ICD-10-CM | POA: Diagnosis not present

## 2012-01-14 DIAGNOSIS — J449 Chronic obstructive pulmonary disease, unspecified: Secondary | ICD-10-CM | POA: Diagnosis not present

## 2012-01-14 DIAGNOSIS — J019 Acute sinusitis, unspecified: Secondary | ICD-10-CM | POA: Diagnosis not present

## 2012-01-14 DIAGNOSIS — K921 Melena: Secondary | ICD-10-CM | POA: Diagnosis not present

## 2012-01-19 ENCOUNTER — Ambulatory Visit (INDEPENDENT_AMBULATORY_CARE_PROVIDER_SITE_OTHER): Payer: Self-pay | Admitting: Internal Medicine

## 2012-01-29 ENCOUNTER — Other Ambulatory Visit: Payer: Self-pay | Admitting: Cardiology

## 2012-02-22 ENCOUNTER — Other Ambulatory Visit: Payer: Self-pay | Admitting: Cardiology

## 2012-02-25 DIAGNOSIS — Z1231 Encounter for screening mammogram for malignant neoplasm of breast: Secondary | ICD-10-CM | POA: Diagnosis not present

## 2012-02-26 DIAGNOSIS — E119 Type 2 diabetes mellitus without complications: Secondary | ICD-10-CM | POA: Diagnosis not present

## 2012-02-26 DIAGNOSIS — H5200 Hypermetropia, unspecified eye: Secondary | ICD-10-CM | POA: Diagnosis not present

## 2012-02-26 DIAGNOSIS — H2510 Age-related nuclear cataract, unspecified eye: Secondary | ICD-10-CM | POA: Diagnosis not present

## 2012-02-26 DIAGNOSIS — H52229 Regular astigmatism, unspecified eye: Secondary | ICD-10-CM | POA: Diagnosis not present

## 2012-02-28 DIAGNOSIS — R079 Chest pain, unspecified: Secondary | ICD-10-CM | POA: Diagnosis not present

## 2012-02-28 DIAGNOSIS — R0789 Other chest pain: Secondary | ICD-10-CM | POA: Diagnosis not present

## 2012-02-28 DIAGNOSIS — R062 Wheezing: Secondary | ICD-10-CM | POA: Diagnosis not present

## 2012-02-28 DIAGNOSIS — I77810 Thoracic aortic ectasia: Secondary | ICD-10-CM | POA: Diagnosis not present

## 2012-02-28 DIAGNOSIS — I517 Cardiomegaly: Secondary | ICD-10-CM | POA: Diagnosis not present

## 2012-02-29 DIAGNOSIS — J441 Chronic obstructive pulmonary disease with (acute) exacerbation: Secondary | ICD-10-CM | POA: Diagnosis present

## 2012-02-29 DIAGNOSIS — K5900 Constipation, unspecified: Secondary | ICD-10-CM | POA: Diagnosis present

## 2012-02-29 DIAGNOSIS — M1710 Unilateral primary osteoarthritis, unspecified knee: Secondary | ICD-10-CM | POA: Diagnosis present

## 2012-02-29 DIAGNOSIS — J45901 Unspecified asthma with (acute) exacerbation: Secondary | ICD-10-CM | POA: Diagnosis present

## 2012-02-29 DIAGNOSIS — I2510 Atherosclerotic heart disease of native coronary artery without angina pectoris: Secondary | ICD-10-CM | POA: Diagnosis not present

## 2012-02-29 DIAGNOSIS — Z87891 Personal history of nicotine dependence: Secondary | ICD-10-CM | POA: Diagnosis not present

## 2012-02-29 DIAGNOSIS — Z6841 Body Mass Index (BMI) 40.0 and over, adult: Secondary | ICD-10-CM | POA: Diagnosis not present

## 2012-02-29 DIAGNOSIS — E785 Hyperlipidemia, unspecified: Secondary | ICD-10-CM | POA: Diagnosis present

## 2012-02-29 DIAGNOSIS — E119 Type 2 diabetes mellitus without complications: Secondary | ICD-10-CM | POA: Diagnosis not present

## 2012-02-29 DIAGNOSIS — K449 Diaphragmatic hernia without obstruction or gangrene: Secondary | ICD-10-CM | POA: Diagnosis present

## 2012-02-29 DIAGNOSIS — R079 Chest pain, unspecified: Secondary | ICD-10-CM | POA: Diagnosis present

## 2012-02-29 DIAGNOSIS — Z7982 Long term (current) use of aspirin: Secondary | ICD-10-CM | POA: Diagnosis not present

## 2012-02-29 DIAGNOSIS — Z794 Long term (current) use of insulin: Secondary | ICD-10-CM | POA: Diagnosis not present

## 2012-02-29 DIAGNOSIS — K219 Gastro-esophageal reflux disease without esophagitis: Secondary | ICD-10-CM | POA: Diagnosis present

## 2012-02-29 DIAGNOSIS — Z880 Allergy status to penicillin: Secondary | ICD-10-CM | POA: Diagnosis not present

## 2012-02-29 DIAGNOSIS — I77810 Thoracic aortic ectasia: Secondary | ICD-10-CM | POA: Diagnosis not present

## 2012-02-29 DIAGNOSIS — Z96659 Presence of unspecified artificial knee joint: Secondary | ICD-10-CM | POA: Diagnosis not present

## 2012-02-29 DIAGNOSIS — E039 Hypothyroidism, unspecified: Secondary | ICD-10-CM | POA: Diagnosis not present

## 2012-02-29 DIAGNOSIS — I517 Cardiomegaly: Secondary | ICD-10-CM | POA: Diagnosis not present

## 2012-02-29 DIAGNOSIS — R0789 Other chest pain: Secondary | ICD-10-CM | POA: Diagnosis not present

## 2012-02-29 DIAGNOSIS — Z79899 Other long term (current) drug therapy: Secondary | ICD-10-CM | POA: Diagnosis not present

## 2012-02-29 DIAGNOSIS — Z8249 Family history of ischemic heart disease and other diseases of the circulatory system: Secondary | ICD-10-CM | POA: Diagnosis not present

## 2012-02-29 DIAGNOSIS — R062 Wheezing: Secondary | ICD-10-CM | POA: Diagnosis not present

## 2012-03-04 ENCOUNTER — Other Ambulatory Visit: Payer: Self-pay | Admitting: Cardiology

## 2012-03-08 DIAGNOSIS — E669 Obesity, unspecified: Secondary | ICD-10-CM | POA: Diagnosis not present

## 2012-03-08 DIAGNOSIS — E119 Type 2 diabetes mellitus without complications: Secondary | ICD-10-CM | POA: Diagnosis not present

## 2012-03-08 DIAGNOSIS — Z794 Long term (current) use of insulin: Secondary | ICD-10-CM | POA: Diagnosis not present

## 2012-03-08 DIAGNOSIS — J441 Chronic obstructive pulmonary disease with (acute) exacerbation: Secondary | ICD-10-CM | POA: Diagnosis not present

## 2012-03-08 DIAGNOSIS — M1710 Unilateral primary osteoarthritis, unspecified knee: Secondary | ICD-10-CM | POA: Diagnosis not present

## 2012-03-08 DIAGNOSIS — I10 Essential (primary) hypertension: Secondary | ICD-10-CM | POA: Diagnosis not present

## 2012-03-17 DIAGNOSIS — J449 Chronic obstructive pulmonary disease, unspecified: Secondary | ICD-10-CM | POA: Diagnosis not present

## 2012-04-04 ENCOUNTER — Other Ambulatory Visit: Payer: Self-pay | Admitting: Cardiology

## 2012-04-22 ENCOUNTER — Other Ambulatory Visit: Payer: Self-pay | Admitting: Cardiology

## 2012-04-22 DIAGNOSIS — J309 Allergic rhinitis, unspecified: Secondary | ICD-10-CM | POA: Diagnosis not present

## 2012-05-23 ENCOUNTER — Ambulatory Visit: Payer: Self-pay | Admitting: Cardiology

## 2012-06-02 DIAGNOSIS — G589 Mononeuropathy, unspecified: Secondary | ICD-10-CM | POA: Diagnosis present

## 2012-06-02 DIAGNOSIS — L02419 Cutaneous abscess of limb, unspecified: Secondary | ICD-10-CM | POA: Diagnosis not present

## 2012-06-02 DIAGNOSIS — L0291 Cutaneous abscess, unspecified: Secondary | ICD-10-CM | POA: Diagnosis not present

## 2012-06-02 DIAGNOSIS — I519 Heart disease, unspecified: Secondary | ICD-10-CM | POA: Diagnosis present

## 2012-06-02 DIAGNOSIS — M25559 Pain in unspecified hip: Secondary | ICD-10-CM | POA: Diagnosis not present

## 2012-06-02 DIAGNOSIS — I517 Cardiomegaly: Secondary | ICD-10-CM | POA: Diagnosis not present

## 2012-06-02 DIAGNOSIS — J45901 Unspecified asthma with (acute) exacerbation: Secondary | ICD-10-CM | POA: Diagnosis present

## 2012-06-02 DIAGNOSIS — I4891 Unspecified atrial fibrillation: Secondary | ICD-10-CM | POA: Diagnosis present

## 2012-06-02 DIAGNOSIS — E119 Type 2 diabetes mellitus without complications: Secondary | ICD-10-CM | POA: Diagnosis not present

## 2012-06-02 DIAGNOSIS — G894 Chronic pain syndrome: Secondary | ICD-10-CM | POA: Diagnosis present

## 2012-06-02 DIAGNOSIS — K449 Diaphragmatic hernia without obstruction or gangrene: Secondary | ICD-10-CM | POA: Diagnosis present

## 2012-06-02 DIAGNOSIS — I10 Essential (primary) hypertension: Secondary | ICD-10-CM | POA: Diagnosis present

## 2012-06-02 DIAGNOSIS — J441 Chronic obstructive pulmonary disease with (acute) exacerbation: Secondary | ICD-10-CM | POA: Diagnosis present

## 2012-06-02 DIAGNOSIS — I452 Bifascicular block: Secondary | ICD-10-CM | POA: Diagnosis not present

## 2012-06-02 DIAGNOSIS — E7800 Pure hypercholesterolemia, unspecified: Secondary | ICD-10-CM | POA: Diagnosis present

## 2012-06-02 DIAGNOSIS — Z880 Allergy status to penicillin: Secondary | ICD-10-CM | POA: Diagnosis not present

## 2012-06-02 DIAGNOSIS — R079 Chest pain, unspecified: Secondary | ICD-10-CM | POA: Diagnosis not present

## 2012-06-02 DIAGNOSIS — Z9981 Dependence on supplemental oxygen: Secondary | ICD-10-CM | POA: Diagnosis not present

## 2012-06-02 DIAGNOSIS — G4733 Obstructive sleep apnea (adult) (pediatric): Secondary | ICD-10-CM | POA: Diagnosis not present

## 2012-06-02 DIAGNOSIS — Z883 Allergy status to other anti-infective agents status: Secondary | ICD-10-CM | POA: Diagnosis not present

## 2012-06-02 DIAGNOSIS — M129 Arthropathy, unspecified: Secondary | ICD-10-CM | POA: Diagnosis present

## 2012-06-02 DIAGNOSIS — K219 Gastro-esophageal reflux disease without esophagitis: Secondary | ICD-10-CM | POA: Diagnosis present

## 2012-06-02 DIAGNOSIS — E039 Hypothyroidism, unspecified: Secondary | ICD-10-CM | POA: Diagnosis present

## 2012-06-02 DIAGNOSIS — I2510 Atherosclerotic heart disease of native coronary artery without angina pectoris: Secondary | ICD-10-CM | POA: Diagnosis present

## 2012-06-02 DIAGNOSIS — K759 Inflammatory liver disease, unspecified: Secondary | ICD-10-CM | POA: Diagnosis present

## 2012-06-02 DIAGNOSIS — M169 Osteoarthritis of hip, unspecified: Secondary | ICD-10-CM | POA: Diagnosis not present

## 2012-06-02 DIAGNOSIS — I509 Heart failure, unspecified: Secondary | ICD-10-CM | POA: Diagnosis present

## 2012-06-02 DIAGNOSIS — R609 Edema, unspecified: Secondary | ICD-10-CM | POA: Diagnosis present

## 2012-06-02 DIAGNOSIS — E785 Hyperlipidemia, unspecified: Secondary | ICD-10-CM | POA: Diagnosis present

## 2012-06-03 DIAGNOSIS — R079 Chest pain, unspecified: Secondary | ICD-10-CM

## 2012-06-09 DIAGNOSIS — J449 Chronic obstructive pulmonary disease, unspecified: Secondary | ICD-10-CM | POA: Diagnosis not present

## 2012-06-09 DIAGNOSIS — G4733 Obstructive sleep apnea (adult) (pediatric): Secondary | ICD-10-CM | POA: Diagnosis not present

## 2012-06-09 DIAGNOSIS — I509 Heart failure, unspecified: Secondary | ICD-10-CM | POA: Diagnosis not present

## 2012-06-09 DIAGNOSIS — L02419 Cutaneous abscess of limb, unspecified: Secondary | ICD-10-CM | POA: Diagnosis not present

## 2012-06-09 DIAGNOSIS — I4891 Unspecified atrial fibrillation: Secondary | ICD-10-CM | POA: Diagnosis not present

## 2012-06-09 DIAGNOSIS — E119 Type 2 diabetes mellitus without complications: Secondary | ICD-10-CM | POA: Diagnosis not present

## 2012-06-13 DIAGNOSIS — I509 Heart failure, unspecified: Secondary | ICD-10-CM | POA: Diagnosis not present

## 2012-06-13 DIAGNOSIS — I4891 Unspecified atrial fibrillation: Secondary | ICD-10-CM | POA: Diagnosis not present

## 2012-06-13 DIAGNOSIS — J449 Chronic obstructive pulmonary disease, unspecified: Secondary | ICD-10-CM | POA: Diagnosis not present

## 2012-06-13 DIAGNOSIS — E119 Type 2 diabetes mellitus without complications: Secondary | ICD-10-CM | POA: Diagnosis not present

## 2012-06-13 DIAGNOSIS — G4733 Obstructive sleep apnea (adult) (pediatric): Secondary | ICD-10-CM | POA: Diagnosis not present

## 2012-06-13 DIAGNOSIS — L02419 Cutaneous abscess of limb, unspecified: Secondary | ICD-10-CM | POA: Diagnosis not present

## 2012-06-17 DIAGNOSIS — I509 Heart failure, unspecified: Secondary | ICD-10-CM | POA: Diagnosis not present

## 2012-06-17 DIAGNOSIS — L02419 Cutaneous abscess of limb, unspecified: Secondary | ICD-10-CM | POA: Diagnosis not present

## 2012-06-17 DIAGNOSIS — E119 Type 2 diabetes mellitus without complications: Secondary | ICD-10-CM | POA: Diagnosis not present

## 2012-06-17 DIAGNOSIS — I4891 Unspecified atrial fibrillation: Secondary | ICD-10-CM | POA: Diagnosis not present

## 2012-06-17 DIAGNOSIS — J449 Chronic obstructive pulmonary disease, unspecified: Secondary | ICD-10-CM | POA: Diagnosis not present

## 2012-06-17 DIAGNOSIS — G4733 Obstructive sleep apnea (adult) (pediatric): Secondary | ICD-10-CM | POA: Diagnosis not present

## 2012-06-20 DIAGNOSIS — H2510 Age-related nuclear cataract, unspecified eye: Secondary | ICD-10-CM | POA: Diagnosis not present

## 2012-06-20 DIAGNOSIS — H3560 Retinal hemorrhage, unspecified eye: Secondary | ICD-10-CM | POA: Diagnosis not present

## 2012-06-20 DIAGNOSIS — E119 Type 2 diabetes mellitus without complications: Secondary | ICD-10-CM | POA: Diagnosis not present

## 2012-06-20 DIAGNOSIS — H4310 Vitreous hemorrhage, unspecified eye: Secondary | ICD-10-CM | POA: Diagnosis not present

## 2012-06-21 DIAGNOSIS — L03119 Cellulitis of unspecified part of limb: Secondary | ICD-10-CM | POA: Diagnosis not present

## 2012-06-21 DIAGNOSIS — E119 Type 2 diabetes mellitus without complications: Secondary | ICD-10-CM | POA: Diagnosis not present

## 2012-06-21 DIAGNOSIS — G4733 Obstructive sleep apnea (adult) (pediatric): Secondary | ICD-10-CM | POA: Diagnosis not present

## 2012-06-21 DIAGNOSIS — J449 Chronic obstructive pulmonary disease, unspecified: Secondary | ICD-10-CM | POA: Diagnosis not present

## 2012-06-21 DIAGNOSIS — I509 Heart failure, unspecified: Secondary | ICD-10-CM | POA: Diagnosis not present

## 2012-06-21 DIAGNOSIS — I4891 Unspecified atrial fibrillation: Secondary | ICD-10-CM | POA: Diagnosis not present

## 2012-06-22 ENCOUNTER — Encounter (HOSPITAL_COMMUNITY): Payer: Self-pay | Admitting: Pharmacy Technician

## 2012-06-23 DIAGNOSIS — I509 Heart failure, unspecified: Secondary | ICD-10-CM | POA: Diagnosis not present

## 2012-06-23 DIAGNOSIS — E119 Type 2 diabetes mellitus without complications: Secondary | ICD-10-CM | POA: Diagnosis not present

## 2012-06-23 DIAGNOSIS — G4733 Obstructive sleep apnea (adult) (pediatric): Secondary | ICD-10-CM | POA: Diagnosis not present

## 2012-06-23 DIAGNOSIS — L02419 Cutaneous abscess of limb, unspecified: Secondary | ICD-10-CM | POA: Diagnosis not present

## 2012-06-23 DIAGNOSIS — I4891 Unspecified atrial fibrillation: Secondary | ICD-10-CM | POA: Diagnosis not present

## 2012-06-23 DIAGNOSIS — J449 Chronic obstructive pulmonary disease, unspecified: Secondary | ICD-10-CM | POA: Diagnosis not present

## 2012-06-27 DIAGNOSIS — J449 Chronic obstructive pulmonary disease, unspecified: Secondary | ICD-10-CM | POA: Diagnosis not present

## 2012-06-27 DIAGNOSIS — I4891 Unspecified atrial fibrillation: Secondary | ICD-10-CM | POA: Diagnosis not present

## 2012-06-27 DIAGNOSIS — E119 Type 2 diabetes mellitus without complications: Secondary | ICD-10-CM | POA: Diagnosis not present

## 2012-06-27 DIAGNOSIS — I509 Heart failure, unspecified: Secondary | ICD-10-CM | POA: Diagnosis not present

## 2012-06-27 DIAGNOSIS — L03119 Cellulitis of unspecified part of limb: Secondary | ICD-10-CM | POA: Diagnosis not present

## 2012-06-27 DIAGNOSIS — G4733 Obstructive sleep apnea (adult) (pediatric): Secondary | ICD-10-CM | POA: Diagnosis not present

## 2012-06-28 ENCOUNTER — Encounter (HOSPITAL_COMMUNITY)
Admission: RE | Admit: 2012-06-28 | Discharge: 2012-06-28 | Disposition: A | Discharge status: Home / Self Care | Payer: Medicare Other | Source: Ambulatory Visit | Attending: Ophthalmology | Admitting: Ophthalmology

## 2012-06-28 ENCOUNTER — Encounter (HOSPITAL_COMMUNITY): Payer: Self-pay

## 2012-06-28 ENCOUNTER — Other Ambulatory Visit: Payer: Self-pay

## 2012-06-28 DIAGNOSIS — Z79899 Other long term (current) drug therapy: Secondary | ICD-10-CM | POA: Diagnosis not present

## 2012-06-28 DIAGNOSIS — I10 Essential (primary) hypertension: Secondary | ICD-10-CM | POA: Diagnosis not present

## 2012-06-28 DIAGNOSIS — H2510 Age-related nuclear cataract, unspecified eye: Secondary | ICD-10-CM | POA: Diagnosis not present

## 2012-06-28 DIAGNOSIS — E119 Type 2 diabetes mellitus without complications: Secondary | ICD-10-CM | POA: Diagnosis not present

## 2012-06-28 DIAGNOSIS — Z01810 Encounter for preprocedural cardiovascular examination: Secondary | ICD-10-CM | POA: Diagnosis not present

## 2012-06-28 DIAGNOSIS — Z794 Long term (current) use of insulin: Secondary | ICD-10-CM | POA: Diagnosis not present

## 2012-06-28 DIAGNOSIS — Z01812 Encounter for preprocedural laboratory examination: Secondary | ICD-10-CM | POA: Diagnosis not present

## 2012-06-28 DIAGNOSIS — Z6841 Body Mass Index (BMI) 40.0 and over, adult: Secondary | ICD-10-CM | POA: Diagnosis not present

## 2012-06-28 HISTORY — DX: Unspecified asthma, uncomplicated: J45.909

## 2012-06-28 HISTORY — DX: Essential (primary) hypertension: I10

## 2012-06-28 HISTORY — DX: Unspecified osteoarthritis, unspecified site: M19.90

## 2012-06-28 HISTORY — DX: Heart failure, unspecified: I50.9

## 2012-06-28 HISTORY — DX: Gout, unspecified: M10.9

## 2012-06-28 LAB — BASIC METABOLIC PANEL
BUN: 19 mg/dL (ref 6–23)
Calcium: 9.3 mg/dL (ref 8.4–10.5)
Creatinine, Ser: 0.66 mg/dL (ref 0.50–1.10)
GFR calc Af Amer: >90 mL/min (ref >90)
GFR calc non Af Amer: >90 mL/min (ref >90)
Glucose, Bld: 209 mg/dL — ABNORMAL HIGH (ref 70–99)

## 2012-06-28 NOTE — Patient Instructions (Addendum)
Your procedure is scheduled on: 07/04/2012  Report to [facility] at  900       AM.  Call this number if you have problems the morning of surgery: [phone]   Do not eat food or drink liquids :After Midnight.      Take these medicines the morning of surgery with A SIP OF WATER: imdur,synthroid,metoprolol,prilosec,accupril   Do not wear jewelry, make-up or nail polish.  Do not wear lotions, powders, or perfumes. You may wear deodorant.  Do not shave 48 hours prior to surgery.  Do not bring valuables to the hospital.  Contacts, dentures or bridgework may not be worn into surgery.  Leave suitcase in the car. After surgery it may be brought to your room.  For patients admitted to the hospital, checkout time is 11:00 AM the day of discharge.   Patients discharged the day of surgery will not be allowed to drive home.  :     Please read over the following fact sheets that you were given: Coughing and Deep Breathing, Surgical Site Infection Prevention, Anesthesia Post-op Instructions and Care and Recovery After Surgery    Cataract A cataract is a clouding of the lens of the eye. When a lens becomes cloudy, vision is reduced based on the degree and nature of the clouding. Many cataracts reduce vision to some degree. Some cataracts make people more near-sighted as they develop. Other cataracts increase glare. Cataracts that are ignored and become worse can sometimes look white. The white color can be seen through the pupil. CAUSES   Aging. However, cataracts may occur at any age, even in newborns.   Certain drugs.   Trauma to the eye.   Certain diseases such as diabetes.   Specific eye diseases such as chronic inflammation inside the eye or a sudden attack of a rare form of glaucoma.   Inherited or acquired medical problems.  SYMPTOMS   Gradual, progressive drop in vision in the affected eye.   Severe, rapid visual loss. This most often happens when trauma is the cause.  DIAGNOSIS  To  detect a cataract, an eye doctor examines the lens. Cataracts are best diagnosed with an exam of the eyes with the pupils enlarged (dilated) by drops.  TREATMENT  For an early cataract, vision may improve by using different eyeglasses or stronger lighting. If that does not help your vision, surgery is the only effective treatment. A cataract needs to be surgically removed when vision loss interferes with your everyday activities, such as driving, reading, or watching TV. A cataract may also have to be removed if it prevents examination or treatment of another eye problem. Surgery removes the cloudy lens and usually replaces it with a substitute lens (intraocular lens, IOL).  At a time when both you and your doctor agree, the cataract will be surgically removed. If you have cataracts in both eyes, only one is usually removed at a time. This allows the operated eye to heal and be out of danger from any possible problems after surgery (such as infection or poor wound healing). In rare cases, a cataract may be doing damage to your eye. In these cases, your caregiver may advise surgical removal right away. The vast majority of people who have cataract surgery have better vision afterward. HOME CARE INSTRUCTIONS  If you are not planning surgery, you may be asked to do the following:  Use different eyeglasses.   Use stronger or brighter lighting.   Ask your eye doctor about  reducing your medicine dose or changing medicines if it is thought that a medicine caused your cataract. Changing medicines does not make the cataract go away on its own.   Become familiar with your surroundings. Poor vision can lead to injury. Avoid bumping into things on the affected side. You are at a higher risk for tripping or falling.   Exercise extreme care when driving or operating machinery.   Wear sunglasses if you are sensitive to bright light or experiencing problems with glare.  SEEK IMMEDIATE MEDICAL CARE IF:   You have  a worsening or sudden vision loss.   You notice redness, swelling, or increasing pain in the eye.   You have a fever.  Document Released: 11/09/2005 Document Revised: 10/29/2011 Document Reviewed: 07/03/2011 [facility] Patient Information 2012 [place], [place].PATIENT INSTRUCTIONS POST-ANESTHESIA  IMMEDIATELY FOLLOWING SURGERY:  Do not drive or operate machinery for the first twenty four hours after surgery.  Do not make any important decisions for twenty four hours after surgery or while taking narcotic pain medications or sedatives.  If you develop intractable nausea and vomiting or a severe headache please notify your doctor immediately.  FOLLOW-UP:  Please make an appointment with your surgeon as instructed. You do not need to follow up with anesthesia unless specifically instructed to do so.  WOUND CARE INSTRUCTIONS (if applicable):  Keep a dry clean dressing on the anesthesia/puncture wound site if there is drainage.  Once the wound has quit draining you may leave it open to air.  Generally you should leave the bandage intact for twenty four hours unless there is drainage.  If the epidural site drains for more than 36-48 hours please call the anesthesia department.  QUESTIONS?:  Please feel free to call your physician or the hospital operator if you have any questions, and they will be happy to assist you.

## 2012-06-29 DIAGNOSIS — J449 Chronic obstructive pulmonary disease, unspecified: Secondary | ICD-10-CM | POA: Diagnosis not present

## 2012-06-29 DIAGNOSIS — L02419 Cutaneous abscess of limb, unspecified: Secondary | ICD-10-CM | POA: Diagnosis not present

## 2012-06-29 DIAGNOSIS — L03119 Cellulitis of unspecified part of limb: Secondary | ICD-10-CM | POA: Diagnosis not present

## 2012-06-30 DIAGNOSIS — J449 Chronic obstructive pulmonary disease, unspecified: Secondary | ICD-10-CM | POA: Diagnosis not present

## 2012-07-01 MED ORDER — NEOMYCIN-POLYMYXIN-DEXAMETH 3.5-10000-0.1 OP OINT
TOPICAL_OINTMENT | OPHTHALMIC | Status: AC
  Filled 2012-07-01: qty 3.5

## 2012-07-01 MED ORDER — PHENYLEPHRINE HCL 2.5 % OP SOLN
OPHTHALMIC | Status: AC
  Filled 2012-07-01: qty 2

## 2012-07-01 MED ORDER — LIDOCAINE HCL 3.5 % OP GEL
OPHTHALMIC | Status: AC
  Filled 2012-07-01: qty 5

## 2012-07-01 MED ORDER — CYCLOPENTOLATE HCL 1 % OP SOLN
OPHTHALMIC | Status: AC
  Filled 2012-07-01: qty 2

## 2012-07-01 MED ORDER — LIDOCAINE HCL (PF) 1 % IJ SOLN
INTRAMUSCULAR | Status: AC
  Filled 2012-07-01: qty 2

## 2012-07-01 MED ORDER — TETRACAINE HCL 0.5 % OP SOLN
OPHTHALMIC | Status: AC
  Filled 2012-07-01: qty 2

## 2012-07-04 ENCOUNTER — Encounter (HOSPITAL_COMMUNITY): Payer: Self-pay | Admitting: Ophthalmology

## 2012-07-04 ENCOUNTER — Encounter (HOSPITAL_COMMUNITY): Payer: Self-pay | Admitting: Anesthesiology

## 2012-07-04 ENCOUNTER — Encounter (HOSPITAL_COMMUNITY): Payer: Self-pay | Admitting: *Deleted

## 2012-07-04 ENCOUNTER — Encounter (HOSPITAL_COMMUNITY)
Admission: RE | Disposition: A | Discharge status: Home / Self Care | Payer: Self-pay | Source: Ambulatory Visit | Attending: Ophthalmology

## 2012-07-04 ENCOUNTER — Ambulatory Visit (HOSPITAL_COMMUNITY): Payer: Medicare Other | Admitting: Anesthesiology

## 2012-07-04 ENCOUNTER — Ambulatory Visit (HOSPITAL_COMMUNITY)
Admission: RE | Admit: 2012-07-04 | Discharge: 2012-07-04 | Disposition: A | Discharge status: Home / Self Care | Payer: Medicare Other | Source: Ambulatory Visit | Attending: Ophthalmology | Admitting: Ophthalmology

## 2012-07-04 DIAGNOSIS — Z01812 Encounter for preprocedural laboratory examination: Secondary | ICD-10-CM | POA: Insufficient documentation

## 2012-07-04 DIAGNOSIS — Z01810 Encounter for preprocedural cardiovascular examination: Secondary | ICD-10-CM | POA: Insufficient documentation

## 2012-07-04 DIAGNOSIS — Z794 Long term (current) use of insulin: Secondary | ICD-10-CM | POA: Insufficient documentation

## 2012-07-04 DIAGNOSIS — E119 Type 2 diabetes mellitus without complications: Secondary | ICD-10-CM | POA: Insufficient documentation

## 2012-07-04 DIAGNOSIS — Z79899 Other long term (current) drug therapy: Secondary | ICD-10-CM | POA: Insufficient documentation

## 2012-07-04 DIAGNOSIS — I10 Essential (primary) hypertension: Secondary | ICD-10-CM | POA: Insufficient documentation

## 2012-07-04 DIAGNOSIS — H2510 Age-related nuclear cataract, unspecified eye: Secondary | ICD-10-CM | POA: Diagnosis not present

## 2012-07-04 DIAGNOSIS — H269 Unspecified cataract: Secondary | ICD-10-CM | POA: Diagnosis not present

## 2012-07-04 DIAGNOSIS — Z6841 Body Mass Index (BMI) 40.0 and over, adult: Secondary | ICD-10-CM | POA: Insufficient documentation

## 2012-07-04 HISTORY — PX: CATARACT EXTRACTION W/PHACO: SHX586

## 2012-07-04 SURGERY — PHACOEMULSIFICATION, CATARACT, WITH IOL INSERTION
Anesthesia: Monitor Anesthesia Care | Site: Eye | Laterality: Right | Wound class: Clean

## 2012-07-04 MED ORDER — LACTATED RINGERS IV SOLN
INTRAVENOUS | Status: DC
  Administered 2012-07-04: 1000 mL via INTRAVENOUS

## 2012-07-04 MED ORDER — LIDOCAINE HCL 3.5 % OP GEL
1.0000 "application " | Freq: Once | OPHTHALMIC | Status: AC
  Administered 2012-07-04: 1 via OPHTHALMIC

## 2012-07-04 MED ORDER — POVIDONE-IODINE 5 % OP SOLN
OPHTHALMIC | Status: DC | PRN
  Administered 2012-07-04: 1 via OPHTHALMIC

## 2012-07-04 MED ORDER — NEOMYCIN-POLYMYXIN-DEXAMETH 0.1 % OP OINT
TOPICAL_OINTMENT | OPHTHALMIC | Status: DC | PRN
  Administered 2012-07-04: 1 via OPHTHALMIC

## 2012-07-04 MED ORDER — TETRACAINE HCL 0.5 % OP SOLN
1.0000 [drp] | OPHTHALMIC | Status: AC
  Administered 2012-07-04 (×3): 1 [drp] via OPHTHALMIC

## 2012-07-04 MED ORDER — PROVISC 10 MG/ML IO SOLN
INTRAOCULAR | Status: DC | PRN
  Administered 2012-07-04: 8.5 mg via INTRAOCULAR

## 2012-07-04 MED ORDER — PHENYLEPHRINE HCL 2.5 % OP SOLN
1.0000 [drp] | OPHTHALMIC | Status: AC
  Administered 2012-07-04 (×3): 1 [drp] via OPHTHALMIC

## 2012-07-04 MED ORDER — EPINEPHRINE HCL 1 MG/ML IJ SOLN
INTRAMUSCULAR | Status: AC
  Filled 2012-07-04: qty 1

## 2012-07-04 MED ORDER — LIDOCAINE HCL (PF) 1 % IJ SOLN
INTRAMUSCULAR | Status: DC | PRN
  Administered 2012-07-04: .6 mL

## 2012-07-04 MED ORDER — LACTATED RINGERS IV SOLN
INTRAVENOUS | Status: DC | PRN
  Administered 2012-07-04: 10:00:00 via INTRAVENOUS

## 2012-07-04 MED ORDER — BSS IO SOLN
INTRAOCULAR | Status: DC | PRN
  Administered 2012-07-04: 15 mL via INTRAOCULAR

## 2012-07-04 MED ORDER — MIDAZOLAM HCL 2 MG/2ML IJ SOLN
INTRAMUSCULAR | Status: AC
  Filled 2012-07-04: qty 2

## 2012-07-04 MED ORDER — MIDAZOLAM HCL 2 MG/2ML IJ SOLN
1.0000 mg | INTRAMUSCULAR | Status: DC | PRN
  Administered 2012-07-04: 2 mg via INTRAVENOUS

## 2012-07-04 MED ORDER — LIDOCAINE 3.5 % OP GEL OPTIME - NO CHARGE
OPHTHALMIC | Status: DC | PRN
  Administered 2012-07-04: 1 [drp] via OPHTHALMIC

## 2012-07-04 MED ORDER — EPINEPHRINE HCL 1 MG/ML IJ SOLN
INTRAOCULAR | Status: DC | PRN
  Administered 2012-07-04: 10:00:00

## 2012-07-04 MED ORDER — CYCLOPENTOLATE HCL 1 % OP SOLN
1.0000 [drp] | OPHTHALMIC | Status: AC
  Administered 2012-07-04 (×3): 1 [drp] via OPHTHALMIC

## 2012-07-04 SURGICAL SUPPLY — 32 items

## 2012-07-04 NOTE — Anesthesia Postprocedure Evaluation (Signed)
  Anesthesia Post-op Note  Patient: Rachel Nguyen  Procedure(s) Performed: Procedure(s) (LRB): CATARACT EXTRACTION PHACO AND INTRAOCULAR LENS PLACEMENT (IOC) (Right)  Patient Location: Short Stay  Anesthesia Type: MAC  Level of Consciousness: awake, alert  and oriented  Airway and Oxygen Therapy: Patient Spontanous Breathing  Post-op Pain: none  Post-op Assessment: Post-op Vital signs reviewed, Patient's Cardiovascular Status Stable, Respiratory Function Stable, Patent Airway, No signs of Nausea or vomiting and Adequate PO intake  Post-op Vital Signs: Reviewed and stable  Complications: No apparent anesthesia complications

## 2012-07-04 NOTE — Transfer of Care (Signed)
Immediate Anesthesia Transfer of Care Note  Patient: Rachel Nguyen  Procedure(s) Performed: Procedure(s) (LRB): CATARACT EXTRACTION PHACO AND INTRAOCULAR LENS PLACEMENT (IOC) (Right)  Patient Location: Short Stay  Anesthesia Type: MAC  Level of Consciousness: awake, alert  and oriented  Airway & Oxygen Therapy: Patient Spontanous Breathing  Post-op Assessment: Report given to PACU RN  Post vital signs: Reviewed and stable  Complications: No apparent anesthesia complications

## 2012-07-04 NOTE — Preoperative (Signed)
Beta Blockers   Reason not to administer Beta Blockers:Not Applicable 

## 2012-07-04 NOTE — Brief Op Note (Signed)
Pre-Op Dx: Cataract OD Post-Op Dx: Cataract OD Surgeon: Sneijder Bernards Anesthesia: Topical with MAC Surgery: Cataract Extraction with Intraocular lens Implant OD Implant: Lenstec, Model Softec HD Blood Loss: None Specimen: None Complications: None 

## 2012-07-04 NOTE — H&P (Signed)
I have reviewed the H&P, the patient was re-examined, and I have identified no interval changes in medical condition and plan of care since the history and physical of record  

## 2012-07-04 NOTE — Anesthesia Preprocedure Evaluation (Signed)
Anesthesia Evaluation  Patient identified by MRN, date of birth, ID band Patient awake    Reviewed: Allergy & Precautions, H&P , NPO status , Patient's Chart, lab work & pertinent test results  Airway Mallampati: III    Mouth opening: Limited Mouth Opening  Dental  (+) Edentulous Upper and Edentulous Lower   Pulmonary shortness of breath and with exertion, asthma , sleep apnea , COPD   + decreased breath sounds      Cardiovascular hypertension, Pt. on medications + CAD and +CHF Rhythm:Regular Rate:Normal     Neuro/Psych    GI/Hepatic GERD-  Medicated,  Endo/Other  Poorly Controlled, Type 2Hypothyroidism Morbid obesity  Renal/GU      Musculoskeletal   Abdominal   Peds  Hematology   Anesthesia Other Findings   Reproductive/Obstetrics                           Anesthesia Physical Anesthesia Plan  ASA: III  Anesthesia Plan: MAC   Post-op Pain Management:    Induction: Intravenous  Airway Management Planned: Nasal Cannula  Additional Equipment:   Intra-op Plan:   Post-operative Plan:   Informed Consent: I have reviewed the patients History and Physical, chart, labs and discussed the procedure including the risks, benefits and alternatives for the proposed anesthesia with the patient or authorized representative who has indicated his/her understanding and acceptance.     Plan Discussed with:   Anesthesia Plan Comments:         Anesthesia Quick Evaluation

## 2012-07-05 NOTE — Op Note (Signed)
NAMEELLENI, MOZINGO NO.:  [id number]  MEDICAL RECORD NO.:  [id number]  LOCATION:  [facility]                          FACILITY:  [facility]  PHYSICIAN:  Susanne Greenhouse, MD       DATE OF BIRTH:  1946-07-22  DATE OF PROCEDURE:  07/04/2012 DATE OF DISCHARGE:  07/04/2012                              OPERATIVE REPORT   PREOPERATIVE DIAGNOSIS:  Nuclear cataract, right eye.  POSTOPERATIVE DIAGNOSIS:  Nuclear cataract, right eye.  DIAGNOSIS CODE:  366.16.  ANESTHESIA:  Topical with monitored anesthesia care and IV sedation.  SURGEON:  Susanne Greenhouse, MD  DESCRIPTION OF THE OPERATION:  In the preoperative area, dilating drops and viscous lidocaine were placed into the right eye.  The patient was then brought into the operating room where she was prepped and draped. Beginning with a 75 blade, a paracentesis was made at the surgeon's 2 o'clock position.  The anterior chamber was then filled with a 1% nonpreserved lidocaine solution.  This was followed by filling the anterior chamber with VisionBlue to stain the anterior capsule.  The VisionBlue was displaced from the anterior chamber with balanced salt solution on a fine cannula.  This was followed by filling the anterior chamber with Viscoat.  A 2.4-mm keratome blade was then used to make a clear corneal incision of the temporal limbus.  A bent cystotome needle and Utrata forceps were used to create a continuous tear capsulotomy. Hydrodissection was performed with balanced salt solution on a fine cannula.  A lens nucleus was then removed using phacoemulsification in a quadrant cracking technique.  Residual cortex was removed with irrigation and aspiration.  The capsular bag and anterior chamber were refilled with Provisc and a posterior chamber intraocular lens was placed into the capsular bag without difficulty.  The Provisc was then removed from the capsular bag and anterior chamber with irrigation and aspiration.  Stromal  hydration of the main incision and paracentesis ports were performed with balanced salt solution on a fine cannula.  The wounds were tested for leak, which were negative.  The patient tolerated the procedure well.  There were no operative complications, and she was returned to the recovery room in satisfactory condition.  No surgical specimens.  Prosthetic device used was a Lenstec posterior chamber lens, model Softec HD, power of 21.0, serial number is [id number].          ______________________________ Susanne Greenhouse, MD     KEH/MEDQ  D:  07/04/2012  T:  07/05/2012  Job:  [id number]

## 2012-07-07 ENCOUNTER — Encounter (HOSPITAL_COMMUNITY): Payer: Self-pay | Admitting: Ophthalmology

## 2012-07-18 DIAGNOSIS — D2310 Other benign neoplasm of skin of unspecified eyelid, including canthus: Secondary | ICD-10-CM | POA: Diagnosis not present

## 2012-07-20 DIAGNOSIS — I4891 Unspecified atrial fibrillation: Secondary | ICD-10-CM | POA: Diagnosis not present

## 2012-07-20 DIAGNOSIS — E119 Type 2 diabetes mellitus without complications: Secondary | ICD-10-CM | POA: Diagnosis not present

## 2012-07-20 DIAGNOSIS — G4733 Obstructive sleep apnea (adult) (pediatric): Secondary | ICD-10-CM | POA: Diagnosis not present

## 2012-07-20 DIAGNOSIS — L03119 Cellulitis of unspecified part of limb: Secondary | ICD-10-CM | POA: Diagnosis not present

## 2012-07-20 DIAGNOSIS — J449 Chronic obstructive pulmonary disease, unspecified: Secondary | ICD-10-CM | POA: Diagnosis not present

## 2012-07-20 DIAGNOSIS — I509 Heart failure, unspecified: Secondary | ICD-10-CM | POA: Diagnosis not present

## 2012-07-20 DIAGNOSIS — L02419 Cutaneous abscess of limb, unspecified: Secondary | ICD-10-CM | POA: Diagnosis not present

## 2012-07-27 DIAGNOSIS — E1039 Type 1 diabetes mellitus with other diabetic ophthalmic complication: Secondary | ICD-10-CM | POA: Diagnosis not present

## 2012-07-27 DIAGNOSIS — H4310 Vitreous hemorrhage, unspecified eye: Secondary | ICD-10-CM | POA: Diagnosis not present

## 2012-07-27 DIAGNOSIS — E11359 Type 2 diabetes mellitus with proliferative diabetic retinopathy without macular edema: Secondary | ICD-10-CM | POA: Diagnosis not present

## 2012-07-27 DIAGNOSIS — E11329 Type 2 diabetes mellitus with mild nonproliferative diabetic retinopathy without macular edema: Secondary | ICD-10-CM | POA: Diagnosis not present

## 2012-07-27 DIAGNOSIS — H33019 Retinal detachment with single break, unspecified eye: Secondary | ICD-10-CM | POA: Diagnosis not present

## 2012-07-28 DIAGNOSIS — H3320 Serous retinal detachment, unspecified eye: Secondary | ICD-10-CM | POA: Diagnosis not present

## 2012-07-28 DIAGNOSIS — H33019 Retinal detachment with single break, unspecified eye: Secondary | ICD-10-CM | POA: Diagnosis not present

## 2012-07-29 DIAGNOSIS — H33019 Retinal detachment with single break, unspecified eye: Secondary | ICD-10-CM | POA: Diagnosis not present

## 2012-08-01 ENCOUNTER — Encounter: Payer: Self-pay | Admitting: Cardiology

## 2012-08-01 ENCOUNTER — Ambulatory Visit (INDEPENDENT_AMBULATORY_CARE_PROVIDER_SITE_OTHER): Payer: Medicare Other | Admitting: Cardiology

## 2012-08-01 VITALS — BP 179/91 | HR 72 | Ht 60.0 in | Wt 299.0 lb

## 2012-08-01 DIAGNOSIS — R0602 Shortness of breath: Secondary | ICD-10-CM | POA: Diagnosis not present

## 2012-08-01 DIAGNOSIS — R609 Edema, unspecified: Secondary | ICD-10-CM | POA: Diagnosis not present

## 2012-08-01 MED ORDER — FUROSEMIDE 20 MG PO TABS: 20.0000 mg | ORAL_TABLET | Freq: Every day | ORAL | Status: DC

## 2012-08-01 MED ORDER — POTASSIUM CHLORIDE CRYS ER 20 MEQ PO TBCR: 40.0000 meq | EXTENDED_RELEASE_TABLET | Freq: Every day | ORAL | Status: AC

## 2012-08-01 MED ORDER — QUINAPRIL HCL 40 MG PO TABS: 40.0000 mg | ORAL_TABLET | Freq: Every day | ORAL | Status: DC

## 2012-08-01 MED ORDER — AMLODIPINE BESYLATE 5 MG PO TABS: 5.0000 mg | ORAL_TABLET | Freq: Every day | ORAL | Status: DC

## 2012-08-01 MED ORDER — METOPROLOL SUCCINATE ER 50 MG PO TB24: 50.0000 mg | ORAL_TABLET | Freq: Every day | ORAL | Status: DC

## 2012-08-01 MED ORDER — ISOSORBIDE MONONITRATE ER 60 MG PO TB24: 90.0000 mg | ORAL_TABLET | Freq: Two times a day (BID) | ORAL | Status: AC

## 2012-08-01 NOTE — Patient Instructions (Addendum)
Your physician recommends that you schedule a follow-up appointment in: 1 year. You will receive a reminder letter in the mail in about 10 months reminding you to call and schedule your appointment. If you don't receive this letter, please contact our office.  Your physician has recommended you make the following change in your medication: Restart amlodipine 5 mg and quinapril 40 mg daily. Increase your furosemide to 40 mg for 2 days only, then resume 20 mg daily. All other medications will remain the same. Your physician recommends that you return for lab work on September 23rd 2013 at [facility] for [employer].

## 2012-08-01 NOTE — Progress Notes (Signed)
HPI The patient presents for followup of dyspnea and known coronary disease. She has chronic shortness of breath with morbid obesity, chronic COPD and a sedentary lifestyle. This seems to be at baseline. She does not describe any new PND or orthopnea. She does not describe any new chest discomfort though she's had a stable pattern of occasional chest pain for which she will use a nitroglycerin. She's very limited in her activities getting around with a walker. She does report that she's had some increased lower extremity swelling. She denies any new palpitations, presyncope or syncope. Of note we have to call her pharmacy a couple of times to clarify medications. She has not been getting her quinapril. She has not been getting her Norvasc.   Allergies  Allergen Reactions  . Morphine Other (See Comments)    Makes crazy  . Penicillins Rash  . Tetracycline Rash    Current Outpatient Prescriptions  Medication Sig Dispense Refill  . albuterol (PROVENTIL) (2.5 MG/3ML) 0.083% nebulizer solution Take 2.5 mg by nebulization every 8 (eight) hours as needed.        Marland Kitchen aspirin EC 81 MG tablet Take 81 mg by mouth daily.      Marland Kitchen docusate sodium (COLACE) 100 MG capsule Take 100 mg by mouth 2 (two) times daily.        Marland Kitchen etodolac (LODINE) 400 MG tablet Take 400 mg by mouth 2 (two) times daily.        . ferrous sulfate (QC FERROUS SULFATE) 325 (65 FE) MG tablet Take 325 mg by mouth daily.        . Fluticasone-Salmeterol (ADVAIR DISKUS) 250-50 MCG/DOSE AEPB Inhale 1 puff into the lungs every 12 (twelve) hours.        . furosemide (LASIX) 20 MG tablet Take 20 mg by mouth daily.        . insulin lispro (HUMALOG) 100 UNIT/ML injection Inject 14 Units into the skin 3 (three) times daily before meals.      . insulin NPH-insulin regular (HUMULIN 70/30) (70-30) 100 UNIT/ML injection Inject 50 Units into the skin 3 (three) times daily.       . isosorbide mononitrate (IMDUR) 60 MG 24 hr tablet Take 90 mg by mouth 2  (two) times daily. Patient takes 1&1/2 tablet twice a day      . levothyroxine (SYNTHROID, LEVOTHROID) 125 MCG tablet Take 125 mcg by mouth daily.        . metoprolol (TOPROL-XL) 50 MG 24 hr tablet Take 50 mg by mouth daily.        Marland Kitchen omeprazole (PRILOSEC) 20 MG capsule Take 20 mg by mouth daily.        . potassium chloride SA (K-DUR,KLOR-CON) 20 MEQ tablet Take 40 mEq by mouth daily.        . quinapril (ACCUPRIL) 40 MG tablet Take 40 mg by mouth daily.        . simvastatin (ZOCOR) 20 MG tablet Take 20 mg by mouth daily.        Marland Kitchen DISCONTD: isosorbide mononitrate (IMDUR) 60 MG 24 hr tablet         Past Medical History  Diagnosis Date  . CAD (coronary artery disease)     non-obstructive, a. cardiac catherezation in April,2007. b. Preseved left ventricular function with no wall motion abnormalities. c. Question coronary vasospasm by cardiac catherization in 2000  . Obesity     Morbid  . Edema     Chronic lower extremity edema  . OSA (  obstructive sleep apnea)     Currently tolerating CPAP  . Chronic obstructive pulmonary disease     a. secondary to second-hand tobacco smoking b. Continuous home oxgen  . Insulin dependent diabetes mellitus   . Dyslipidemia     Tolerating Crestor  . Chronic lower back pain   . Gastroesophageal reflux disease   . Rectal bleed   . Chronic constipation   . COPD (chronic obstructive pulmonary disease)   . Hemorrhoids   . Hematochezia   . Dysphagia   . CHF (congestive heart failure)   . Asthma   . Hypertension   . Arthritis   . Gout     Past Surgical History  Procedure Date  . Cardiac catheterization   . Cholecystectomy   . Total knee arthroplasty     bilateral  . Vesicovaginal fistula closure w/ tah     With bilateral tubal ligation and ovarian cyst removal  . Upper gastrointestinal endoscopy 08/25/07    EGD ED  . Colonoscopy 09  . Colonoscopy 02/06/06  . Colonoscopy 01/25/04    DEMASON  . Sigmoidoscopy 11/17/06   . Tubal ligation   .  Cataract extraction w/phaco 07/04/2012    Procedure: CATARACT EXTRACTION PHACO AND INTRAOCULAR LENS PLACEMENT (IOC);  Surgeon: Gemma Payor, MD;  Location: [facility];  Service: Ophthalmology;  Laterality: Right;  CDE 19.60    ROS:  As stated in the HPI and negative for all other systems.  PHYSICAL EXAM BP 179/91  Pulse 72  Ht 5' (1.524 m)  Wt 299 lb (135.626 kg)  BMI 58.39 kg/m2 GENERAL:  Well appearing HEENT:  Pupils equal round and reactive, fundi not visualized, oral mucosa unremarkable NECK:  No jugular venous distention, waveform within normal limits, carotid upstroke brisk and symmetric, no bruits, no thyromegaly LYMPHATICS:  No cervical, inguinal adenopathy LUNGS:  Bilateral expiratory wheezes BACK:  No CVA tenderness CHEST:  Unremarkable HEART:  PMI not displaced or sustained,S1 and S2 within normal limits, no S3, no S4, no clicks, no rubs, no murmurs ABD:  Flat, positive bowel sounds normal in frequency in pitch, no bruits, no rebound, no guarding, no midline pulsatile mass, no hepatomegaly, no splenomegaly, morbidly obese EXT:  2 plus pulses throughout, moderate lower extremity edema, no cyanosis no clubbing SKIN:  No rashes no nodules NEURO:  Cranial nerves II through XII grossly intact, motor grossly intact throughout PSYCH:  Cognitively intact, oriented to person place and time  EKG:  Sinus rhythm, right bundle branch block, no acute ST-T wave changes.  ASSESSMENT AND PLAN  SHORTNESS OF BREATH  I think this is certainly  morbid obesity and chronic lung disease. She does have a little extra lower extremity edema so she will take an extra 20 mg of Lasix for the next 2 days. Otherwise I don't think any further cardiovascular workup is suggested at this point.  PRECORDIAL PAIN  She has a stable chest pain pattern. She can continue the meds as listed. No further workup is suggested.  OBSTRUCTIVE SLEEP APNEA  She is unable to wear CPAP unfortunately. No further workup is  planned.  HTN  Her blood pressure is not well controlled. However, she's not been getting the quinapril or amlodipine and I will restart these. She will need a basic metabolic profile in 2 weeks.  MORBID OBESITY We discussed diet and I gave her specific instructions.

## 2012-08-15 DIAGNOSIS — R0602 Shortness of breath: Secondary | ICD-10-CM | POA: Diagnosis not present

## 2012-08-15 DIAGNOSIS — R609 Edema, unspecified: Secondary | ICD-10-CM | POA: Diagnosis not present

## 2012-08-22 ENCOUNTER — Telehealth: Payer: Self-pay | Admitting: *Deleted

## 2012-08-22 NOTE — Telephone Encounter (Signed)
Message copied by Lesle Chris on Mon Aug 22, 2012 10:41 AM ------      Message from: Rollene Rotunda      Created: Sun Aug 21, 2012  7:30 PM       Labs OK.  Call Ms. Foot with the results and send results to [facility] A, MD

## 2012-08-22 NOTE — Telephone Encounter (Signed)
Notes Recorded by Lesle Chris, LPN on 0/98/1191 at 10:41 AM Patient notified.  Copy forwarded to PMD.

## 2012-08-30 DIAGNOSIS — J209 Acute bronchitis, unspecified: Secondary | ICD-10-CM | POA: Diagnosis not present

## 2012-09-17 DIAGNOSIS — E119 Type 2 diabetes mellitus without complications: Secondary | ICD-10-CM | POA: Diagnosis not present

## 2012-09-17 DIAGNOSIS — Z9981 Dependence on supplemental oxygen: Secondary | ICD-10-CM | POA: Diagnosis not present

## 2012-09-17 DIAGNOSIS — J45909 Unspecified asthma, uncomplicated: Secondary | ICD-10-CM | POA: Diagnosis not present

## 2012-09-17 DIAGNOSIS — Z794 Long term (current) use of insulin: Secondary | ICD-10-CM | POA: Diagnosis not present

## 2012-09-20 DIAGNOSIS — Z794 Long term (current) use of insulin: Secondary | ICD-10-CM | POA: Diagnosis not present

## 2012-09-20 DIAGNOSIS — J45909 Unspecified asthma, uncomplicated: Secondary | ICD-10-CM | POA: Diagnosis not present

## 2012-09-20 DIAGNOSIS — E119 Type 2 diabetes mellitus without complications: Secondary | ICD-10-CM | POA: Diagnosis not present

## 2012-09-20 DIAGNOSIS — Z9981 Dependence on supplemental oxygen: Secondary | ICD-10-CM | POA: Diagnosis not present

## 2012-09-22 DIAGNOSIS — J45909 Unspecified asthma, uncomplicated: Secondary | ICD-10-CM | POA: Diagnosis not present

## 2012-09-22 DIAGNOSIS — Z794 Long term (current) use of insulin: Secondary | ICD-10-CM | POA: Diagnosis not present

## 2012-09-22 DIAGNOSIS — E119 Type 2 diabetes mellitus without complications: Secondary | ICD-10-CM | POA: Diagnosis not present

## 2012-09-22 DIAGNOSIS — Z9981 Dependence on supplemental oxygen: Secondary | ICD-10-CM | POA: Diagnosis not present

## 2012-09-26 DIAGNOSIS — Z9981 Dependence on supplemental oxygen: Secondary | ICD-10-CM | POA: Diagnosis not present

## 2012-09-26 DIAGNOSIS — H10029 Other mucopurulent conjunctivitis, unspecified eye: Secondary | ICD-10-CM | POA: Diagnosis not present

## 2012-09-26 DIAGNOSIS — E119 Type 2 diabetes mellitus without complications: Secondary | ICD-10-CM | POA: Diagnosis not present

## 2012-09-26 DIAGNOSIS — J45909 Unspecified asthma, uncomplicated: Secondary | ICD-10-CM | POA: Diagnosis not present

## 2012-09-26 DIAGNOSIS — Z794 Long term (current) use of insulin: Secondary | ICD-10-CM | POA: Diagnosis not present

## 2012-09-28 DIAGNOSIS — Z9981 Dependence on supplemental oxygen: Secondary | ICD-10-CM | POA: Diagnosis not present

## 2012-09-28 DIAGNOSIS — E119 Type 2 diabetes mellitus without complications: Secondary | ICD-10-CM | POA: Diagnosis not present

## 2012-09-28 DIAGNOSIS — J45909 Unspecified asthma, uncomplicated: Secondary | ICD-10-CM | POA: Diagnosis not present

## 2012-09-28 DIAGNOSIS — Z794 Long term (current) use of insulin: Secondary | ICD-10-CM | POA: Diagnosis not present

## 2012-10-03 DIAGNOSIS — Z794 Long term (current) use of insulin: Secondary | ICD-10-CM | POA: Diagnosis not present

## 2012-10-03 DIAGNOSIS — J45909 Unspecified asthma, uncomplicated: Secondary | ICD-10-CM | POA: Diagnosis not present

## 2012-10-03 DIAGNOSIS — E119 Type 2 diabetes mellitus without complications: Secondary | ICD-10-CM | POA: Diagnosis not present

## 2012-10-03 DIAGNOSIS — Z9981 Dependence on supplemental oxygen: Secondary | ICD-10-CM | POA: Diagnosis not present

## 2012-10-06 DIAGNOSIS — E119 Type 2 diabetes mellitus without complications: Secondary | ICD-10-CM | POA: Diagnosis not present

## 2012-10-06 DIAGNOSIS — Z794 Long term (current) use of insulin: Secondary | ICD-10-CM | POA: Diagnosis not present

## 2012-10-06 DIAGNOSIS — Z9981 Dependence on supplemental oxygen: Secondary | ICD-10-CM | POA: Diagnosis not present

## 2012-10-06 DIAGNOSIS — J45909 Unspecified asthma, uncomplicated: Secondary | ICD-10-CM | POA: Diagnosis not present

## 2012-10-13 DIAGNOSIS — E119 Type 2 diabetes mellitus without complications: Secondary | ICD-10-CM | POA: Diagnosis not present

## 2012-10-13 DIAGNOSIS — Z794 Long term (current) use of insulin: Secondary | ICD-10-CM | POA: Diagnosis not present

## 2012-10-13 DIAGNOSIS — Z9981 Dependence on supplemental oxygen: Secondary | ICD-10-CM | POA: Diagnosis not present

## 2012-10-13 DIAGNOSIS — J45909 Unspecified asthma, uncomplicated: Secondary | ICD-10-CM | POA: Diagnosis not present

## 2012-10-20 DIAGNOSIS — Z794 Long term (current) use of insulin: Secondary | ICD-10-CM | POA: Diagnosis not present

## 2012-10-20 DIAGNOSIS — E119 Type 2 diabetes mellitus without complications: Secondary | ICD-10-CM | POA: Diagnosis not present

## 2012-10-20 DIAGNOSIS — Z9981 Dependence on supplemental oxygen: Secondary | ICD-10-CM | POA: Diagnosis not present

## 2012-10-20 DIAGNOSIS — J45909 Unspecified asthma, uncomplicated: Secondary | ICD-10-CM | POA: Diagnosis not present

## 2012-11-01 DIAGNOSIS — J449 Chronic obstructive pulmonary disease, unspecified: Secondary | ICD-10-CM | POA: Diagnosis not present

## 2012-11-04 DIAGNOSIS — Z7982 Long term (current) use of aspirin: Secondary | ICD-10-CM | POA: Diagnosis not present

## 2012-11-04 DIAGNOSIS — Z6841 Body Mass Index (BMI) 40.0 and over, adult: Secondary | ICD-10-CM | POA: Diagnosis not present

## 2012-11-04 DIAGNOSIS — Z883 Allergy status to other anti-infective agents status: Secondary | ICD-10-CM | POA: Diagnosis not present

## 2012-11-04 DIAGNOSIS — Z886 Allergy status to analgesic agent status: Secondary | ICD-10-CM | POA: Diagnosis not present

## 2012-11-04 DIAGNOSIS — R0602 Shortness of breath: Secondary | ICD-10-CM | POA: Diagnosis not present

## 2012-11-04 DIAGNOSIS — K449 Diaphragmatic hernia without obstruction or gangrene: Secondary | ICD-10-CM | POA: Diagnosis present

## 2012-11-04 DIAGNOSIS — G4730 Sleep apnea, unspecified: Secondary | ICD-10-CM | POA: Diagnosis present

## 2012-11-04 DIAGNOSIS — K219 Gastro-esophageal reflux disease without esophagitis: Secondary | ICD-10-CM | POA: Diagnosis present

## 2012-11-04 DIAGNOSIS — M1710 Unilateral primary osteoarthritis, unspecified knee: Secondary | ICD-10-CM | POA: Diagnosis present

## 2012-11-04 DIAGNOSIS — E785 Hyperlipidemia, unspecified: Secondary | ICD-10-CM | POA: Diagnosis present

## 2012-11-04 DIAGNOSIS — E039 Hypothyroidism, unspecified: Secondary | ICD-10-CM | POA: Diagnosis present

## 2012-11-04 DIAGNOSIS — Z880 Allergy status to penicillin: Secondary | ICD-10-CM | POA: Diagnosis not present

## 2012-11-04 DIAGNOSIS — I2510 Atherosclerotic heart disease of native coronary artery without angina pectoris: Secondary | ICD-10-CM | POA: Diagnosis not present

## 2012-11-04 DIAGNOSIS — I509 Heart failure, unspecified: Secondary | ICD-10-CM | POA: Diagnosis not present

## 2012-11-04 DIAGNOSIS — J441 Chronic obstructive pulmonary disease with (acute) exacerbation: Secondary | ICD-10-CM | POA: Diagnosis not present

## 2012-11-04 DIAGNOSIS — Z23 Encounter for immunization: Secondary | ICD-10-CM | POA: Diagnosis not present

## 2012-11-04 DIAGNOSIS — I10 Essential (primary) hypertension: Secondary | ICD-10-CM | POA: Diagnosis not present

## 2012-11-04 DIAGNOSIS — I517 Cardiomegaly: Secondary | ICD-10-CM | POA: Diagnosis not present

## 2012-11-04 DIAGNOSIS — G589 Mononeuropathy, unspecified: Secondary | ICD-10-CM | POA: Diagnosis present

## 2012-11-04 DIAGNOSIS — Z794 Long term (current) use of insulin: Secondary | ICD-10-CM | POA: Diagnosis not present

## 2012-11-04 DIAGNOSIS — E119 Type 2 diabetes mellitus without complications: Secondary | ICD-10-CM | POA: Diagnosis present

## 2012-11-04 DIAGNOSIS — J45901 Unspecified asthma with (acute) exacerbation: Secondary | ICD-10-CM | POA: Diagnosis not present

## 2012-11-11 DIAGNOSIS — I10 Essential (primary) hypertension: Secondary | ICD-10-CM | POA: Diagnosis not present

## 2012-11-11 DIAGNOSIS — E119 Type 2 diabetes mellitus without complications: Secondary | ICD-10-CM | POA: Diagnosis not present

## 2012-11-11 DIAGNOSIS — I2510 Atherosclerotic heart disease of native coronary artery without angina pectoris: Secondary | ICD-10-CM | POA: Diagnosis not present

## 2012-11-11 DIAGNOSIS — I509 Heart failure, unspecified: Secondary | ICD-10-CM | POA: Diagnosis not present

## 2012-11-11 DIAGNOSIS — J441 Chronic obstructive pulmonary disease with (acute) exacerbation: Secondary | ICD-10-CM | POA: Diagnosis not present

## 2012-11-11 DIAGNOSIS — M1710 Unilateral primary osteoarthritis, unspecified knee: Secondary | ICD-10-CM | POA: Diagnosis not present

## 2012-11-14 DIAGNOSIS — M1710 Unilateral primary osteoarthritis, unspecified knee: Secondary | ICD-10-CM | POA: Diagnosis not present

## 2012-11-14 DIAGNOSIS — I10 Essential (primary) hypertension: Secondary | ICD-10-CM | POA: Diagnosis not present

## 2012-11-14 DIAGNOSIS — J441 Chronic obstructive pulmonary disease with (acute) exacerbation: Secondary | ICD-10-CM | POA: Diagnosis not present

## 2012-11-14 DIAGNOSIS — I509 Heart failure, unspecified: Secondary | ICD-10-CM | POA: Diagnosis not present

## 2012-11-14 DIAGNOSIS — E119 Type 2 diabetes mellitus without complications: Secondary | ICD-10-CM | POA: Diagnosis not present

## 2012-11-14 DIAGNOSIS — I2510 Atherosclerotic heart disease of native coronary artery without angina pectoris: Secondary | ICD-10-CM | POA: Diagnosis not present

## 2012-11-15 DIAGNOSIS — J441 Chronic obstructive pulmonary disease with (acute) exacerbation: Secondary | ICD-10-CM | POA: Diagnosis not present

## 2012-11-15 DIAGNOSIS — E119 Type 2 diabetes mellitus without complications: Secondary | ICD-10-CM | POA: Diagnosis not present

## 2012-11-15 DIAGNOSIS — I10 Essential (primary) hypertension: Secondary | ICD-10-CM | POA: Diagnosis not present

## 2012-11-15 DIAGNOSIS — M1710 Unilateral primary osteoarthritis, unspecified knee: Secondary | ICD-10-CM | POA: Diagnosis not present

## 2012-11-15 DIAGNOSIS — I2510 Atherosclerotic heart disease of native coronary artery without angina pectoris: Secondary | ICD-10-CM | POA: Diagnosis not present

## 2012-11-15 DIAGNOSIS — I509 Heart failure, unspecified: Secondary | ICD-10-CM | POA: Diagnosis not present

## 2012-11-18 DIAGNOSIS — I509 Heart failure, unspecified: Secondary | ICD-10-CM | POA: Diagnosis not present

## 2012-11-18 DIAGNOSIS — E119 Type 2 diabetes mellitus without complications: Secondary | ICD-10-CM | POA: Diagnosis not present

## 2012-11-18 DIAGNOSIS — M1710 Unilateral primary osteoarthritis, unspecified knee: Secondary | ICD-10-CM | POA: Diagnosis not present

## 2012-11-18 DIAGNOSIS — J441 Chronic obstructive pulmonary disease with (acute) exacerbation: Secondary | ICD-10-CM | POA: Diagnosis not present

## 2012-11-18 DIAGNOSIS — I10 Essential (primary) hypertension: Secondary | ICD-10-CM | POA: Diagnosis not present

## 2012-11-18 DIAGNOSIS — I2510 Atherosclerotic heart disease of native coronary artery without angina pectoris: Secondary | ICD-10-CM | POA: Diagnosis not present

## 2012-11-21 DIAGNOSIS — J441 Chronic obstructive pulmonary disease with (acute) exacerbation: Secondary | ICD-10-CM | POA: Diagnosis not present

## 2012-11-21 DIAGNOSIS — I2510 Atherosclerotic heart disease of native coronary artery without angina pectoris: Secondary | ICD-10-CM | POA: Diagnosis not present

## 2012-11-21 DIAGNOSIS — M1710 Unilateral primary osteoarthritis, unspecified knee: Secondary | ICD-10-CM | POA: Diagnosis not present

## 2012-11-21 DIAGNOSIS — E119 Type 2 diabetes mellitus without complications: Secondary | ICD-10-CM | POA: Diagnosis not present

## 2012-11-21 DIAGNOSIS — I509 Heart failure, unspecified: Secondary | ICD-10-CM | POA: Diagnosis not present

## 2012-11-21 DIAGNOSIS — I10 Essential (primary) hypertension: Secondary | ICD-10-CM | POA: Diagnosis not present

## 2012-11-24 DIAGNOSIS — M1710 Unilateral primary osteoarthritis, unspecified knee: Secondary | ICD-10-CM | POA: Diagnosis not present

## 2012-11-24 DIAGNOSIS — I509 Heart failure, unspecified: Secondary | ICD-10-CM | POA: Diagnosis not present

## 2012-11-24 DIAGNOSIS — I2510 Atherosclerotic heart disease of native coronary artery without angina pectoris: Secondary | ICD-10-CM | POA: Diagnosis not present

## 2012-11-24 DIAGNOSIS — I10 Essential (primary) hypertension: Secondary | ICD-10-CM | POA: Diagnosis not present

## 2012-11-24 DIAGNOSIS — J441 Chronic obstructive pulmonary disease with (acute) exacerbation: Secondary | ICD-10-CM | POA: Diagnosis not present

## 2012-11-24 DIAGNOSIS — E119 Type 2 diabetes mellitus without complications: Secondary | ICD-10-CM | POA: Diagnosis not present

## 2012-11-29 DIAGNOSIS — I509 Heart failure, unspecified: Secondary | ICD-10-CM | POA: Diagnosis not present

## 2012-11-29 DIAGNOSIS — M1710 Unilateral primary osteoarthritis, unspecified knee: Secondary | ICD-10-CM | POA: Diagnosis not present

## 2012-11-29 DIAGNOSIS — J441 Chronic obstructive pulmonary disease with (acute) exacerbation: Secondary | ICD-10-CM | POA: Diagnosis not present

## 2012-11-29 DIAGNOSIS — E119 Type 2 diabetes mellitus without complications: Secondary | ICD-10-CM | POA: Diagnosis not present

## 2012-11-29 DIAGNOSIS — I2510 Atherosclerotic heart disease of native coronary artery without angina pectoris: Secondary | ICD-10-CM | POA: Diagnosis not present

## 2012-11-29 DIAGNOSIS — I10 Essential (primary) hypertension: Secondary | ICD-10-CM | POA: Diagnosis not present

## 2012-12-06 DIAGNOSIS — E1065 Type 1 diabetes mellitus with hyperglycemia: Secondary | ICD-10-CM | POA: Diagnosis not present

## 2012-12-06 DIAGNOSIS — E11329 Type 2 diabetes mellitus with mild nonproliferative diabetic retinopathy without macular edema: Secondary | ICD-10-CM | POA: Diagnosis not present

## 2012-12-06 DIAGNOSIS — H43819 Vitreous degeneration, unspecified eye: Secondary | ICD-10-CM | POA: Diagnosis not present

## 2012-12-06 DIAGNOSIS — H2510 Age-related nuclear cataract, unspecified eye: Secondary | ICD-10-CM | POA: Diagnosis not present

## 2012-12-08 DIAGNOSIS — J449 Chronic obstructive pulmonary disease, unspecified: Secondary | ICD-10-CM | POA: Diagnosis not present

## 2012-12-09 DIAGNOSIS — J441 Chronic obstructive pulmonary disease with (acute) exacerbation: Secondary | ICD-10-CM | POA: Diagnosis not present

## 2012-12-09 DIAGNOSIS — I10 Essential (primary) hypertension: Secondary | ICD-10-CM | POA: Diagnosis not present

## 2012-12-09 DIAGNOSIS — I509 Heart failure, unspecified: Secondary | ICD-10-CM | POA: Diagnosis not present

## 2012-12-09 DIAGNOSIS — I2510 Atherosclerotic heart disease of native coronary artery without angina pectoris: Secondary | ICD-10-CM | POA: Diagnosis not present

## 2012-12-09 DIAGNOSIS — E119 Type 2 diabetes mellitus without complications: Secondary | ICD-10-CM | POA: Diagnosis not present

## 2012-12-09 DIAGNOSIS — M1710 Unilateral primary osteoarthritis, unspecified knee: Secondary | ICD-10-CM | POA: Diagnosis not present

## 2012-12-15 DIAGNOSIS — I10 Essential (primary) hypertension: Secondary | ICD-10-CM | POA: Diagnosis not present

## 2012-12-15 DIAGNOSIS — E119 Type 2 diabetes mellitus without complications: Secondary | ICD-10-CM | POA: Diagnosis not present

## 2012-12-15 DIAGNOSIS — M1710 Unilateral primary osteoarthritis, unspecified knee: Secondary | ICD-10-CM | POA: Diagnosis not present

## 2012-12-15 DIAGNOSIS — J441 Chronic obstructive pulmonary disease with (acute) exacerbation: Secondary | ICD-10-CM | POA: Diagnosis not present

## 2012-12-15 DIAGNOSIS — I2510 Atherosclerotic heart disease of native coronary artery without angina pectoris: Secondary | ICD-10-CM | POA: Diagnosis not present

## 2012-12-15 DIAGNOSIS — I509 Heart failure, unspecified: Secondary | ICD-10-CM | POA: Diagnosis not present

## 2012-12-21 DIAGNOSIS — I509 Heart failure, unspecified: Secondary | ICD-10-CM | POA: Diagnosis not present

## 2012-12-21 DIAGNOSIS — I10 Essential (primary) hypertension: Secondary | ICD-10-CM | POA: Diagnosis not present

## 2012-12-21 DIAGNOSIS — I2510 Atherosclerotic heart disease of native coronary artery without angina pectoris: Secondary | ICD-10-CM | POA: Diagnosis not present

## 2012-12-21 DIAGNOSIS — J441 Chronic obstructive pulmonary disease with (acute) exacerbation: Secondary | ICD-10-CM | POA: Diagnosis not present

## 2012-12-21 DIAGNOSIS — E119 Type 2 diabetes mellitus without complications: Secondary | ICD-10-CM | POA: Diagnosis not present

## 2012-12-21 DIAGNOSIS — M1710 Unilateral primary osteoarthritis, unspecified knee: Secondary | ICD-10-CM | POA: Diagnosis not present

## 2012-12-26 DIAGNOSIS — I509 Heart failure, unspecified: Secondary | ICD-10-CM | POA: Diagnosis not present

## 2012-12-26 DIAGNOSIS — J441 Chronic obstructive pulmonary disease with (acute) exacerbation: Secondary | ICD-10-CM | POA: Diagnosis not present

## 2012-12-26 DIAGNOSIS — M1710 Unilateral primary osteoarthritis, unspecified knee: Secondary | ICD-10-CM | POA: Diagnosis not present

## 2012-12-26 DIAGNOSIS — I2510 Atherosclerotic heart disease of native coronary artery without angina pectoris: Secondary | ICD-10-CM | POA: Diagnosis not present

## 2012-12-26 DIAGNOSIS — I10 Essential (primary) hypertension: Secondary | ICD-10-CM | POA: Diagnosis not present

## 2012-12-26 DIAGNOSIS — E119 Type 2 diabetes mellitus without complications: Secondary | ICD-10-CM | POA: Diagnosis not present

## 2013-01-05 DIAGNOSIS — I10 Essential (primary) hypertension: Secondary | ICD-10-CM | POA: Diagnosis not present

## 2013-01-05 DIAGNOSIS — J441 Chronic obstructive pulmonary disease with (acute) exacerbation: Secondary | ICD-10-CM | POA: Diagnosis not present

## 2013-01-05 DIAGNOSIS — M1710 Unilateral primary osteoarthritis, unspecified knee: Secondary | ICD-10-CM | POA: Diagnosis not present

## 2013-01-05 DIAGNOSIS — I2510 Atherosclerotic heart disease of native coronary artery without angina pectoris: Secondary | ICD-10-CM | POA: Diagnosis not present

## 2013-01-05 DIAGNOSIS — E119 Type 2 diabetes mellitus without complications: Secondary | ICD-10-CM | POA: Diagnosis not present

## 2013-01-05 DIAGNOSIS — I509 Heart failure, unspecified: Secondary | ICD-10-CM | POA: Diagnosis not present

## 2013-01-10 DIAGNOSIS — M1710 Unilateral primary osteoarthritis, unspecified knee: Secondary | ICD-10-CM | POA: Diagnosis not present

## 2013-01-10 DIAGNOSIS — I509 Heart failure, unspecified: Secondary | ICD-10-CM | POA: Diagnosis not present

## 2013-01-10 DIAGNOSIS — I2510 Atherosclerotic heart disease of native coronary artery without angina pectoris: Secondary | ICD-10-CM | POA: Diagnosis not present

## 2013-01-10 DIAGNOSIS — E119 Type 2 diabetes mellitus without complications: Secondary | ICD-10-CM | POA: Diagnosis not present

## 2013-01-10 DIAGNOSIS — J441 Chronic obstructive pulmonary disease with (acute) exacerbation: Secondary | ICD-10-CM | POA: Diagnosis not present

## 2013-01-10 DIAGNOSIS — I10 Essential (primary) hypertension: Secondary | ICD-10-CM | POA: Diagnosis not present

## 2013-01-11 DIAGNOSIS — I2510 Atherosclerotic heart disease of native coronary artery without angina pectoris: Secondary | ICD-10-CM | POA: Diagnosis not present

## 2013-01-11 DIAGNOSIS — I10 Essential (primary) hypertension: Secondary | ICD-10-CM | POA: Diagnosis not present

## 2013-01-11 DIAGNOSIS — M1710 Unilateral primary osteoarthritis, unspecified knee: Secondary | ICD-10-CM | POA: Diagnosis not present

## 2013-01-11 DIAGNOSIS — I509 Heart failure, unspecified: Secondary | ICD-10-CM | POA: Diagnosis not present

## 2013-01-11 DIAGNOSIS — E119 Type 2 diabetes mellitus without complications: Secondary | ICD-10-CM | POA: Diagnosis not present

## 2013-01-11 DIAGNOSIS — J441 Chronic obstructive pulmonary disease with (acute) exacerbation: Secondary | ICD-10-CM | POA: Diagnosis not present

## 2013-01-25 DIAGNOSIS — M1710 Unilateral primary osteoarthritis, unspecified knee: Secondary | ICD-10-CM | POA: Diagnosis not present

## 2013-01-25 DIAGNOSIS — E119 Type 2 diabetes mellitus without complications: Secondary | ICD-10-CM | POA: Diagnosis not present

## 2013-01-25 DIAGNOSIS — J441 Chronic obstructive pulmonary disease with (acute) exacerbation: Secondary | ICD-10-CM | POA: Diagnosis not present

## 2013-01-25 DIAGNOSIS — I2510 Atherosclerotic heart disease of native coronary artery without angina pectoris: Secondary | ICD-10-CM | POA: Diagnosis not present

## 2013-01-25 DIAGNOSIS — I10 Essential (primary) hypertension: Secondary | ICD-10-CM | POA: Diagnosis not present

## 2013-01-25 DIAGNOSIS — I509 Heart failure, unspecified: Secondary | ICD-10-CM | POA: Diagnosis not present

## 2013-01-31 DIAGNOSIS — D2330 Other benign neoplasm of skin of unspecified part of face: Secondary | ICD-10-CM | POA: Diagnosis not present

## 2013-02-08 DIAGNOSIS — J441 Chronic obstructive pulmonary disease with (acute) exacerbation: Secondary | ICD-10-CM | POA: Diagnosis not present

## 2013-02-08 DIAGNOSIS — I509 Heart failure, unspecified: Secondary | ICD-10-CM | POA: Diagnosis not present

## 2013-02-08 DIAGNOSIS — E119 Type 2 diabetes mellitus without complications: Secondary | ICD-10-CM | POA: Diagnosis not present

## 2013-02-08 DIAGNOSIS — M1710 Unilateral primary osteoarthritis, unspecified knee: Secondary | ICD-10-CM | POA: Diagnosis not present

## 2013-02-08 DIAGNOSIS — I2510 Atherosclerotic heart disease of native coronary artery without angina pectoris: Secondary | ICD-10-CM | POA: Diagnosis not present

## 2013-02-08 DIAGNOSIS — I10 Essential (primary) hypertension: Secondary | ICD-10-CM | POA: Diagnosis not present

## 2013-02-21 DIAGNOSIS — I10 Essential (primary) hypertension: Secondary | ICD-10-CM | POA: Diagnosis not present

## 2013-02-21 DIAGNOSIS — E119 Type 2 diabetes mellitus without complications: Secondary | ICD-10-CM | POA: Diagnosis not present

## 2013-02-21 DIAGNOSIS — I509 Heart failure, unspecified: Secondary | ICD-10-CM | POA: Diagnosis not present

## 2013-02-21 DIAGNOSIS — I2510 Atherosclerotic heart disease of native coronary artery without angina pectoris: Secondary | ICD-10-CM | POA: Diagnosis not present

## 2013-02-21 DIAGNOSIS — J441 Chronic obstructive pulmonary disease with (acute) exacerbation: Secondary | ICD-10-CM | POA: Diagnosis not present

## 2013-02-21 DIAGNOSIS — M1710 Unilateral primary osteoarthritis, unspecified knee: Secondary | ICD-10-CM | POA: Diagnosis not present

## 2013-03-07 DIAGNOSIS — I2510 Atherosclerotic heart disease of native coronary artery without angina pectoris: Secondary | ICD-10-CM | POA: Diagnosis not present

## 2013-03-07 DIAGNOSIS — I509 Heart failure, unspecified: Secondary | ICD-10-CM | POA: Diagnosis not present

## 2013-03-07 DIAGNOSIS — E119 Type 2 diabetes mellitus without complications: Secondary | ICD-10-CM | POA: Diagnosis not present

## 2013-03-07 DIAGNOSIS — J441 Chronic obstructive pulmonary disease with (acute) exacerbation: Secondary | ICD-10-CM | POA: Diagnosis not present

## 2013-03-07 DIAGNOSIS — M1710 Unilateral primary osteoarthritis, unspecified knee: Secondary | ICD-10-CM | POA: Diagnosis not present

## 2013-03-07 DIAGNOSIS — I10 Essential (primary) hypertension: Secondary | ICD-10-CM | POA: Diagnosis not present

## 2013-03-13 DIAGNOSIS — R51 Headache: Secondary | ICD-10-CM | POA: Diagnosis not present

## 2013-03-13 DIAGNOSIS — N390 Urinary tract infection, site not specified: Secondary | ICD-10-CM | POA: Diagnosis not present

## 2013-03-13 DIAGNOSIS — S199XXA Unspecified injury of neck, initial encounter: Secondary | ICD-10-CM | POA: Diagnosis not present

## 2013-03-13 DIAGNOSIS — E872 Acidosis: Secondary | ICD-10-CM | POA: Diagnosis not present

## 2013-03-13 DIAGNOSIS — E119 Type 2 diabetes mellitus without complications: Secondary | ICD-10-CM | POA: Diagnosis not present

## 2013-03-13 DIAGNOSIS — R079 Chest pain, unspecified: Secondary | ICD-10-CM | POA: Diagnosis not present

## 2013-03-13 DIAGNOSIS — I10 Essential (primary) hypertension: Secondary | ICD-10-CM | POA: Diagnosis not present

## 2013-03-13 DIAGNOSIS — IMO0002 Reserved for concepts with insufficient information to code with codable children: Secondary | ICD-10-CM | POA: Diagnosis not present

## 2013-03-13 DIAGNOSIS — R071 Chest pain on breathing: Secondary | ICD-10-CM | POA: Diagnosis not present

## 2013-03-13 DIAGNOSIS — S3981XA Other specified injuries of abdomen, initial encounter: Secondary | ICD-10-CM | POA: Diagnosis not present

## 2013-03-13 DIAGNOSIS — R109 Unspecified abdominal pain: Secondary | ICD-10-CM | POA: Diagnosis not present

## 2013-03-13 DIAGNOSIS — E039 Hypothyroidism, unspecified: Secondary | ICD-10-CM | POA: Diagnosis not present

## 2013-03-13 DIAGNOSIS — S0993XA Unspecified injury of face, initial encounter: Secondary | ICD-10-CM | POA: Diagnosis not present

## 2013-03-13 DIAGNOSIS — I509 Heart failure, unspecified: Secondary | ICD-10-CM | POA: Diagnosis not present

## 2013-03-13 DIAGNOSIS — S298XXA Other specified injuries of thorax, initial encounter: Secondary | ICD-10-CM | POA: Diagnosis not present

## 2013-03-13 DIAGNOSIS — Z794 Long term (current) use of insulin: Secondary | ICD-10-CM | POA: Diagnosis not present

## 2013-03-13 DIAGNOSIS — J449 Chronic obstructive pulmonary disease, unspecified: Secondary | ICD-10-CM | POA: Diagnosis not present

## 2013-03-13 DIAGNOSIS — K7689 Other specified diseases of liver: Secondary | ICD-10-CM | POA: Diagnosis not present

## 2013-03-13 DIAGNOSIS — Z79899 Other long term (current) drug therapy: Secondary | ICD-10-CM | POA: Diagnosis not present

## 2013-03-13 DIAGNOSIS — M47812 Spondylosis without myelopathy or radiculopathy, cervical region: Secondary | ICD-10-CM | POA: Diagnosis not present

## 2013-03-13 DIAGNOSIS — S0990XA Unspecified injury of head, initial encounter: Secondary | ICD-10-CM | POA: Diagnosis not present

## 2013-03-21 DIAGNOSIS — I10 Essential (primary) hypertension: Secondary | ICD-10-CM | POA: Diagnosis not present

## 2013-03-21 DIAGNOSIS — E782 Mixed hyperlipidemia: Secondary | ICD-10-CM | POA: Diagnosis not present

## 2013-03-31 DIAGNOSIS — N899 Noninflammatory disorder of vagina, unspecified: Secondary | ICD-10-CM | POA: Diagnosis not present

## 2013-03-31 DIAGNOSIS — E039 Hypothyroidism, unspecified: Secondary | ICD-10-CM | POA: Diagnosis not present

## 2013-03-31 DIAGNOSIS — R300 Dysuria: Secondary | ICD-10-CM | POA: Diagnosis not present

## 2013-04-27 DIAGNOSIS — R300 Dysuria: Secondary | ICD-10-CM | POA: Diagnosis not present

## 2013-05-09 DIAGNOSIS — H109 Unspecified conjunctivitis: Secondary | ICD-10-CM | POA: Diagnosis not present

## 2013-05-30 DIAGNOSIS — E1159 Type 2 diabetes mellitus with other circulatory complications: Secondary | ICD-10-CM | POA: Diagnosis not present

## 2013-05-30 DIAGNOSIS — I739 Peripheral vascular disease, unspecified: Secondary | ICD-10-CM | POA: Diagnosis not present

## 2013-05-30 DIAGNOSIS — I509 Heart failure, unspecified: Secondary | ICD-10-CM | POA: Diagnosis not present

## 2013-06-07 ENCOUNTER — Encounter (INDEPENDENT_AMBULATORY_CARE_PROVIDER_SITE_OTHER): Payer: Medicare Other

## 2013-06-07 ENCOUNTER — Other Ambulatory Visit: Payer: Self-pay | Admitting: *Deleted

## 2013-06-07 ENCOUNTER — Encounter: Payer: Self-pay | Admitting: Cardiology

## 2013-06-07 DIAGNOSIS — I739 Peripheral vascular disease, unspecified: Secondary | ICD-10-CM

## 2013-06-07 DIAGNOSIS — R600 Localized edema: Secondary | ICD-10-CM

## 2013-06-12 ENCOUNTER — Telehealth: Payer: Self-pay | Admitting: Physician Assistant

## 2013-06-12 NOTE — Telephone Encounter (Signed)
Patient wanted insulin refilled. I informed her that I could only refill her cardiac medications that she would have to get her insulin refilled by her family doctor. She said Hasanaj wasn't her family doctor anymore that it was a woman and she didn't know her name. Asked me if I could tell her the woman's name and I looked in her chart but couldn't see who her name family doctor was and I informed the patient of this.

## 2013-06-12 NOTE — Telephone Encounter (Signed)
Patient wanting refill

## 2013-06-14 ENCOUNTER — Other Ambulatory Visit: Payer: Self-pay

## 2013-06-14 ENCOUNTER — Other Ambulatory Visit (INDEPENDENT_AMBULATORY_CARE_PROVIDER_SITE_OTHER): Payer: Medicare Other

## 2013-06-14 DIAGNOSIS — R0989 Other specified symptoms and signs involving the circulatory and respiratory systems: Secondary | ICD-10-CM

## 2013-06-14 DIAGNOSIS — R0609 Other forms of dyspnea: Secondary | ICD-10-CM

## 2013-06-16 ENCOUNTER — Ambulatory Visit (INDEPENDENT_AMBULATORY_CARE_PROVIDER_SITE_OTHER): Payer: Medicare Other | Admitting: Physician Assistant

## 2013-06-16 ENCOUNTER — Encounter: Payer: Self-pay | Admitting: Physician Assistant

## 2013-06-16 VITALS — BP 150/85 | HR 93 | Ht 60.0 in | Wt 315.0 lb

## 2013-06-16 DIAGNOSIS — R0602 Shortness of breath: Secondary | ICD-10-CM | POA: Diagnosis not present

## 2013-06-16 DIAGNOSIS — Z87898 Personal history of other specified conditions: Secondary | ICD-10-CM

## 2013-06-16 DIAGNOSIS — I5032 Chronic diastolic (congestive) heart failure: Secondary | ICD-10-CM

## 2013-06-16 DIAGNOSIS — I10 Essential (primary) hypertension: Secondary | ICD-10-CM

## 2013-06-16 DIAGNOSIS — R079 Chest pain, unspecified: Secondary | ICD-10-CM | POA: Insufficient documentation

## 2013-06-16 DIAGNOSIS — R072 Precordial pain: Secondary | ICD-10-CM | POA: Diagnosis not present

## 2013-06-16 DIAGNOSIS — R609 Edema, unspecified: Secondary | ICD-10-CM | POA: Insufficient documentation

## 2013-06-16 DIAGNOSIS — Z8669 Personal history of other diseases of the nervous system and sense organs: Secondary | ICD-10-CM

## 2013-06-16 MED ORDER — TORSEMIDE 20 MG PO TABS: 20.0000 mg | ORAL_TABLET | Freq: Two times a day (BID) | ORAL | Status: DC

## 2013-06-16 MED ORDER — AMLODIPINE BESYLATE 5 MG PO TABS: 5.0000 mg | ORAL_TABLET | Freq: Every day | ORAL | Status: AC

## 2013-06-16 NOTE — Assessment & Plan Note (Signed)
Chronic, with recent echocardiogram indicating NL RVF. Will try to treat aggressively with diuretic therapy.

## 2013-06-16 NOTE — Patient Instructions (Addendum)
YOU WILL BE REFERRED TO A BREATHING SPECIALIST TO GET REEVALUATED  Your physician has recommended you make the following change in your medication:   STOP YOUR LASIX START YOUR DEMADEX 20 MG TWICE A DAY (TWELVE HOURS APART)  WE WILL REFILL YOUR NORVASC  Your physician recommends that you return for lab work in: CMET/BNP TODAY AT [facility]  Your physician recommends that you schedule a follow-up appointment in:  F/U IN 2WKS WITH EUGENE SERPE

## 2013-06-16 NOTE — Assessment & Plan Note (Signed)
Will substitute Lasix with Demadex 20 mg twice a day, for more aggressive diuretic treatment for her chronic diastolic heart failure, as well as marked volume overload. Of note, she has gained an additional 16 pounds since her last OV in September 2013. Will check baseline labs today, including BNP level, and reassess her clinical status with me in 2 weeks.

## 2013-06-16 NOTE — Assessment & Plan Note (Signed)
Patient reports being unable to tolerate her CPAP mask. Will refer her to Dr. Josephina Shih for reevaluation of her OSA, and possible titration of her CPAP machine.

## 2013-06-16 NOTE — Assessment & Plan Note (Signed)
No further workup currently indicated. Patient has long-standing history of CP syndrome with history of nonobstructive CAD by multiple cardiac catheterizations, most recently in April 2013. She also had an echocardiogram earlier this week indicating NL LVF with no focal WMAs.

## 2013-06-16 NOTE — Assessment & Plan Note (Signed)
Will discontinue Adalat and resume amlodipine at previous dose of 5 mg daily.

## 2013-06-16 NOTE — Progress Notes (Signed)
Primary Cardiologist: Prentice Docker, MD   HPI: Scheduled annual followup, last seen here in clinic in September 2013, by Dr. Antoine Poche.  She presents today with essential complaint of refractory hypertension, and is in the process of establishing with a new primary care doctor in [place]. She was previously followed by Dr. Olena Leatherwood.  Clinically, she remains quite limited in her mobility, ambulating when she can with a rolling walker. She continues to have chronic exertional dyspnea, with occasional CP, and is on continuous supplemental oxygen. She continues to sleep with her head raised, but denies PND. She has marked bilateral peripheral edema, and is on twice daily Lasix regimen. She is also morbidly obese, with a current weight of 315.  Of note, she had a complete echocardiogram earlier this week, per Dr. Antoine Poche, with results as follows:   - Echocardiogram, July 23: The sixth EF 65%; grade 1 diastolic dysfunction; NL RVF; mild AR/MR/TR   Twelve-lead EKG today, reviewed by me, indicates NSR at 91 bpm; chronic RBBB; no significant change from prior study  Allergies  Allergen Reactions  . Morphine Other (See Comments)    Makes crazy  . Penicillins Rash  . Tetracycline Rash    Current Outpatient Prescriptions  Medication Sig Dispense Refill  . albuterol (PROVENTIL) (2.5 MG/3ML) 0.083% nebulizer solution Take 2.5 mg by nebulization every 8 (eight) hours as needed.        Marland Kitchen amLODipine (NORVASC) 5 MG tablet Take 1 tablet (5 mg total) by mouth daily.  30 tablet  6  . aspirin EC 81 MG tablet Take 81 mg by mouth daily.      Marland Kitchen docusate sodium (COLACE) 100 MG capsule Take 100 mg by mouth 2 (two) times daily.        Marland Kitchen etodolac (LODINE) 400 MG tablet Take 400 mg by mouth 2 (two) times daily.        . ferrous sulfate (QC FERROUS SULFATE) 325 (65 FE) MG tablet Take 325 mg by mouth daily.        Marland Kitchen HYDROcodone-acetaminophen (NORCO) 7.5-325 MG per tablet Take 1 tablet by mouth every 6 (six)  hours as needed for pain.      Marland Kitchen insulin lispro (HUMALOG) 100 UNIT/ML injection Inject 14 Units into the skin 3 (three) times daily before meals.      . insulin NPH-insulin regular (HUMULIN 70/30) (70-30) 100 UNIT/ML injection Inject 50-60 Units into the skin 3 (three) times daily.       . isosorbide mononitrate (IMDUR) 60 MG 24 hr tablet Take 1.5 tablets (90 mg total) by mouth 2 (two) times daily. Patient takes 1&1/2 tablet twice a day  90 tablet  6  . levothyroxine (SYNTHROID, LEVOTHROID) 125 MCG tablet Take 125 mcg by mouth daily.        Marland Kitchen NIFEdipine (ADALAT CC) 90 MG 24 hr tablet Take 90 mg by mouth daily.      Marland Kitchen omeprazole (PRILOSEC) 20 MG capsule Take 40 mg by mouth daily.       . potassium chloride SA (K-DUR,KLOR-CON) 20 MEQ tablet Take 2 tablets (40 mEq total) by mouth daily.  60 tablet  6  . simvastatin (ZOCOR) 20 MG tablet Take 20 mg by mouth daily.         No current facility-administered medications for this visit.    Past Medical History  Diagnosis Date  . CAD (coronary artery disease)     non-obstructive, a. cardiac catherezation in April,2007. b. Preseved left ventricular function with no wall motion  abnormalities. c. Question coronary vasospasm by cardiac catherization in 2000  . Obesity     Morbid  . Edema     Chronic lower extremity edema  . OSA (obstructive sleep apnea)     Currently tolerating CPAP  . Chronic obstructive pulmonary disease     a. secondary to second-hand tobacco smoking b. Continuous home oxgen  . Insulin dependent diabetes mellitus   . Dyslipidemia     Tolerating Crestor  . Chronic lower back pain   . Gastroesophageal reflux disease   . Rectal bleed   . Chronic constipation   . COPD (chronic obstructive pulmonary disease)   . Hemorrhoids   . Hematochezia   . Dysphagia   . Chronic diastolic heart failure   . Asthma   . Hypertension   . Arthritis   . Gout     Past Surgical History  Procedure Laterality Date  . Cardiac catheterization     . Cholecystectomy    . Total knee arthroplasty      bilateral  . Vesicovaginal fistula closure w/ tah      With bilateral tubal ligation and ovarian cyst removal  . Upper gastrointestinal endoscopy  08/25/07    EGD ED  . Colonoscopy  09  . Colonoscopy  02/06/06  . Colonoscopy  01/25/04    DEMASON  . Sigmoidoscopy  11/17/06   . Tubal ligation    . Cataract extraction w/phaco  07/04/2012    Procedure: CATARACT EXTRACTION PHACO AND INTRAOCULAR LENS PLACEMENT (IOC);  Surgeon: Gemma Payor, MD;  Location: [facility];  Service: Ophthalmology;  Laterality: Right;  CDE 19.60    History   Social History  . Marital Status: Married    Spouse Name: N/A    Number of Children: N/A  . Years of Education: N/A   Occupational History  . Retired    Social History Main Topics  . Smoking status: Never Smoker   . Smokeless tobacco: Not on file  . Alcohol Use: No  . Drug Use: No  . Sexually Active: Yes    Birth Control/ Protection: Surgical   Other Topics Concern  . Not on file   Social History Narrative   Does not get regular exercise    Family History  Problem Relation Age of Onset  . Kidney failure Father   . Other Sister     Breast Lumps    ROS: no nausea, vomiting; no fever, chills; no melena, hematochezia; no claudication  PHYSICAL EXAM: BP 150/85  Pulse 93  Ht 5' (1.524 m)  Wt 315 lb (142.883 kg)  BMI 61.52 kg/m2 GENERAL: 67 year old female, morbidly obese; NAD HEENT: NCAT, PERRLA, EOMI; sclera clear; no xanthelasma; nasal cannula  NECK: palpable bilateral carotid pulses, no bruits; unable to assess JVD, secondary to neck girth  LUNGS: Mild bibasilar crackles, no wheezes CARDIAC: RRR (S1, S2); 2-3/6 systolic ejection murmur at base ABDOMEN: Protuberant EXTREMETIES: 4+ bilateral, pitting peripheral edema SKIN: warm/dry; no obvious rash/lesions MUSCULOSKELETAL: no joint deformity NEURO: no focal deficit; NL affect   EKG: reviewed and available in Electronic  Records   ASSESSMENT & PLAN:  Chronic diastolic heart failure Will substitute Lasix with Demadex 20 mg twice a day, for more aggressive diuretic treatment for her chronic diastolic heart failure, as well as marked volume overload. Of note, she has gained an additional 16 pounds since her last OV in September 2013. Will check baseline labs today, including BNP level, and reassess her clinical status with me in 2 weeks.  HYPERTENSION, BENIGN Will discontinue Adalat and resume amlodipine at previous dose of 5 mg daily.   Chest pain No further workup currently indicated. Patient has long-standing history of CP syndrome with history of nonobstructive CAD by multiple cardiac catheterizations, most recently in April 2013. She also had an echocardiogram earlier this week indicating NL LVF with no focal WMAs.  Edema Chronic, with recent echocardiogram indicating NL RVF. Will try to treat aggressively with diuretic therapy.  OBSTRUCTIVE SLEEP APNEA Patient reports being unable to tolerate her CPAP mask. Will refer her to Dr. Josephina Shih for reevaluation of her OSA, and possible titration of her CPAP machine.    Gene Jaynee Winters, PAC

## 2013-06-20 ENCOUNTER — Encounter: Payer: Self-pay | Admitting: *Deleted

## 2013-06-26 DIAGNOSIS — I10 Essential (primary) hypertension: Secondary | ICD-10-CM | POA: Diagnosis not present

## 2013-06-26 DIAGNOSIS — E1159 Type 2 diabetes mellitus with other circulatory complications: Secondary | ICD-10-CM | POA: Diagnosis not present

## 2013-06-26 DIAGNOSIS — J309 Allergic rhinitis, unspecified: Secondary | ICD-10-CM | POA: Diagnosis not present

## 2013-06-26 DIAGNOSIS — J449 Chronic obstructive pulmonary disease, unspecified: Secondary | ICD-10-CM | POA: Diagnosis not present

## 2013-06-26 DIAGNOSIS — E039 Hypothyroidism, unspecified: Secondary | ICD-10-CM | POA: Diagnosis not present

## 2013-06-26 DIAGNOSIS — G4733 Obstructive sleep apnea (adult) (pediatric): Secondary | ICD-10-CM | POA: Diagnosis not present

## 2013-06-26 DIAGNOSIS — I739 Peripheral vascular disease, unspecified: Secondary | ICD-10-CM | POA: Diagnosis not present

## 2013-07-10 ENCOUNTER — Telehealth: Payer: Self-pay | Admitting: Cardiology

## 2013-07-10 NOTE — Telephone Encounter (Signed)
Advised patient to contact medicaid again in reference to changing her provider on her card. She has [place] [employer] and the provider that is currently listed on card will no longer approve visits under their name.

## 2013-07-13 ENCOUNTER — Ambulatory Visit: Payer: Medicare Other | Admitting: Physician Assistant

## 2013-07-25 DIAGNOSIS — H2510 Age-related nuclear cataract, unspecified eye: Secondary | ICD-10-CM | POA: Diagnosis not present

## 2013-07-25 DIAGNOSIS — Z961 Presence of intraocular lens: Secondary | ICD-10-CM | POA: Diagnosis not present

## 2013-07-25 DIAGNOSIS — E119 Type 2 diabetes mellitus without complications: Secondary | ICD-10-CM | POA: Diagnosis not present

## 2013-08-07 DIAGNOSIS — E559 Vitamin D deficiency, unspecified: Secondary | ICD-10-CM | POA: Diagnosis not present

## 2013-08-07 DIAGNOSIS — I872 Venous insufficiency (chronic) (peripheral): Secondary | ICD-10-CM | POA: Diagnosis not present

## 2013-08-07 DIAGNOSIS — B372 Candidiasis of skin and nail: Secondary | ICD-10-CM | POA: Diagnosis not present

## 2013-08-07 DIAGNOSIS — E1159 Type 2 diabetes mellitus with other circulatory complications: Secondary | ICD-10-CM | POA: Diagnosis not present

## 2013-08-07 DIAGNOSIS — G894 Chronic pain syndrome: Secondary | ICD-10-CM | POA: Diagnosis not present

## 2013-08-07 DIAGNOSIS — J069 Acute upper respiratory infection, unspecified: Secondary | ICD-10-CM | POA: Diagnosis not present

## 2013-08-07 NOTE — Patient Instructions (Addendum)
Your procedure is scheduled on:  2020-09-713  Report to [facility] at 7:00     AM.  Call this number if you have problems the morning of surgery: [phone]   Remember:   Do not eat or drink :After Midnight.    Take these medicines the morning of surgery with A SIP OF WATER:  Synthroid, Imdur, Amlodipine and use albuterol inhaler   Do not wear jewelry, make-up or nail polish.  Do not wear lotions, powders, or perfumes. You may wear deodorant.  Do not shave 48 hours prior to surgery.  Do not bring valuables to the hospital.  Contacts, dentures or bridgework may not be worn into surgery.  Patients discharged the day of surgery will not be allowed to drive home.  Name and phone number of your driver:    Please read over the following fact sheets that you were given: Pain Booklet, Surgical Site Infection Prevention, Anesthesia Post-op Instructions and Care and Recovery After Surgery  Cataract Surgery  A cataract is a clouding of the lens of the eye. When a lens becomes cloudy, vision is reduced based on the degree and nature of the clouding. Surgery may be needed to improve vision. Surgery removes the cloudy lens and usually replaces it with a substitute lens (intraocular lens, IOL). LET YOUR EYE DOCTOR KNOW ABOUT:  Allergies to food or medicine.   Medicines taken including herbs, eyedrops, over-the-counter medicines, and creams.   Use of steroids (by mouth or creams).   Previous problems with anesthetics or numbing medicine.   History of bleeding problems or blood clots.   Previous surgery.   Other health problems, including diabetes and kidney problems.   Possibility of pregnancy, if this applies.  RISKS AND COMPLICATIONS  Infection.   Inflammation of the eyeball (endophthalmitis) that can spread to both eyes (sympathetic ophthalmia).   Poor wound healing.   If an IOL is inserted, it can later fall out of proper position. This is very uncommon.   Clouding of the part of  your eye that holds an IOL in place. This is called an "after-cataract." These are uncommon, but easily treated.  BEFORE THE PROCEDURE  Do not eat or drink anything except small amounts of water for 8 to 12 before your surgery, or as directed by your caregiver.   Unless you are told otherwise, continue any eyedrops you have been prescribed.   Talk to your primary caregiver about all other medicines that you take (both prescription and non-prescription). In some cases, you may need to stop or change medicines near the time of your surgery. This is most important if you are taking blood-thinning medicine.Do not stop medicines unless you are told to do so.   Arrange for someone to drive you to and from the procedure.   Do not put contact lenses in either eye on the day of your surgery.  PROCEDURE There is more than one method for safely removing a cataract. Your doctor can explain the differences and help determine which is best for you. Phacoemulsification surgery is the most common form of cataract surgery.  An injection is given behind the eye or eyedrops are given to make this a painless procedure.   A small cut (incision) is made on the edge of the clear, dome-shaped surface that covers the front of the eye (cornea).   A tiny probe is painlessly inserted into the eye. This device gives off ultrasound waves that soften and break up the cloudy center of  the lens. This makes it easier for the cloudy lens to be removed by suction.   An IOL may be implanted.   The normal lens of the eye is covered by a clear capsule. Part of that capsule is intentionally left in the eye to support the IOL.   Your surgeon may or may not use stitches to close the incision.  There are other forms of cataract surgery that require a larger incision and stiches to close the eye. This approach is taken in cases where the doctor feels that the cataract cannot be easily removed using phacoemulsification. AFTER THE  PROCEDURE  When an IOL is implanted, it does not need care. It becomes a permanent part of your eye and cannot be seen or felt.   Your doctor will schedule follow-up exams to check on your progress.   Review your other medicines with your doctor to see which can be resumed after surgery.   Use eyedrops or take medicine as prescribed by your doctor.  Document Released: 10/29/2011 Document Reviewed: 10/26/2011 [facility] Patient Information 2012 [place], [place].  .Cataract Surgery Care After Refer to this sheet in the next few weeks. These instructions provide you with information on caring for yourself after your procedure. Your caregiver may also give you more specific instructions. Your treatment has been planned according to current medical practices, but problems sometimes occur. Call your caregiver if you have any problems or questions after your procedure.  HOME CARE INSTRUCTIONS   Avoid strenuous activities as directed by your caregiver.   Ask your caregiver when you can resume driving.   Use eyedrops or other medicines to help healing and control pressure inside your eye as directed by your caregiver.   Only take over-the-counter or prescription medicines for pain, discomfort, or fever as directed by your caregiver.   Do not to touch or rub your eyes.   You may be instructed to use a protective shield during the first few days and nights after surgery. If not, wear sunglasses to protect your eyes. This is to protect the eye from pressure or from being accidentally bumped.   Keep the area around your eye clean and dry. Avoid swimming or allowing water to hit you directly in the face while showering. Keep soap and shampoo out of your eyes.   Do not bend or lift heavy objects. Bending increases pressure in the eye. You can walk, climb stairs, and do light household chores.   Do not put a contact lens into the eye that had surgery until your caregiver says it is okay to do so.    Ask your doctor when you can return to work. This will depend on the kind of work that you do. If you work in a dusty environment, you may be advised to wear protective eyewear for a period of time.   Ask your caregiver when it will be safe to engage in sexual activity.   Continue with your regular eye exams as directed by your caregiver.  What to expect:  It is normal to feel itching and mild discomfort for a few days after cataract surgery. Some fluid discharge is also common, and your eye may be sensitive to light and touch.   After 1 to 2 days, even moderate discomfort should disappear. In most cases, healing will take about 6 weeks.   If you received an intraocular lens (IOL), you may notice that colors are very bright or have a blue tinge. Also, if you have  been in bright sunlight, everything may appear reddish for a few hours. If you see these color tinges, it is because your lens is clear and no longer cloudy. Within a few months after receiving an IOL, these extra colors should go away. When you have healed, you will probably need new glasses.  SEEK MEDICAL CARE IF:   You have increased bruising around your eye.   You have discomfort not helped by medicine.  SEEK IMMEDIATE MEDICAL CARE IF:   You have a fever.   You have a worsening or sudden vision loss.   You have redness, swelling, or increasing pain in the eye.   You have a thick discharge from the eye that had surgery.  MAKE SURE YOU:  Understand these instructions.   Will watch your condition.   Will get help right away if you are not doing well or get worse.  Document Released: 05/29/2005 Document Revised: 10/29/2011 Document Reviewed: 07/03/2011 [facility] Patient Information 2012 [place], [place].

## 2013-08-08 ENCOUNTER — Encounter (HOSPITAL_COMMUNITY)
Admission: RE | Admit: 2013-08-08 | Discharge: 2013-08-08 | Disposition: A | Discharge status: Home / Self Care | Payer: Medicare Other | Source: Ambulatory Visit | Attending: Ophthalmology | Admitting: Ophthalmology

## 2013-08-08 ENCOUNTER — Encounter (HOSPITAL_COMMUNITY): Payer: Self-pay

## 2013-08-08 DIAGNOSIS — E109 Type 1 diabetes mellitus without complications: Secondary | ICD-10-CM | POA: Diagnosis not present

## 2013-08-08 DIAGNOSIS — J441 Chronic obstructive pulmonary disease with (acute) exacerbation: Secondary | ICD-10-CM | POA: Diagnosis not present

## 2013-08-08 DIAGNOSIS — J209 Acute bronchitis, unspecified: Secondary | ICD-10-CM | POA: Diagnosis not present

## 2013-08-08 DIAGNOSIS — E669 Obesity, unspecified: Secondary | ICD-10-CM | POA: Diagnosis not present

## 2013-08-08 HISTORY — DX: Personal history of other diseases of the digestive system: Z87.19

## 2013-08-08 HISTORY — DX: Dependence on supplemental oxygen: Z99.81

## 2013-08-08 HISTORY — DX: Shortness of breath: R06.02

## 2013-08-08 NOTE — Progress Notes (Signed)
Pt. Arrived for pre-op interview.  Pt. Has audible wheezing & has complaints of "not feeling well" & discomfort in her chest.  Pt. Is on home O2 & sats are 87-91%.  Pt. Stated that she went to see her primary MD yesterday for an upper respiratory infection & an antibiotic had been prescribed.  She was unable to get it filled d/t her pharmacy being closed.  Pt. States that she is to see Dr. Juanetta Gosling today for her lungs.  Dr. Jayme Cloud aware of pt. Symptoms & what her situation was.  Ordered for procedure to be postponed for now & rescheduled in 2 weeks when pt. Has completed antibiotic course for URI.  Dr. Ashley Royalty office aware.

## 2013-08-15 ENCOUNTER — Ambulatory Visit (HOSPITAL_COMMUNITY): Admission: RE | Admit: 2013-08-15 | Payer: Medicare Other | Source: Ambulatory Visit | Admitting: Ophthalmology

## 2013-08-15 ENCOUNTER — Encounter (HOSPITAL_COMMUNITY): Admission: RE | Payer: Self-pay | Source: Ambulatory Visit

## 2013-08-15 SURGERY — PHACOEMULSIFICATION, CATARACT, WITH IOL INSERTION
Anesthesia: Monitor Anesthesia Care | Laterality: Left

## 2013-09-04 DIAGNOSIS — K219 Gastro-esophageal reflux disease without esophagitis: Secondary | ICD-10-CM | POA: Diagnosis not present

## 2013-09-04 DIAGNOSIS — Z23 Encounter for immunization: Secondary | ICD-10-CM | POA: Diagnosis not present

## 2013-09-04 DIAGNOSIS — J301 Allergic rhinitis due to pollen: Secondary | ICD-10-CM | POA: Diagnosis not present

## 2013-09-19 DIAGNOSIS — I10 Essential (primary) hypertension: Secondary | ICD-10-CM | POA: Diagnosis not present

## 2013-09-19 DIAGNOSIS — J209 Acute bronchitis, unspecified: Secondary | ICD-10-CM | POA: Diagnosis not present

## 2013-09-19 DIAGNOSIS — J45902 Unspecified asthma with status asthmaticus: Secondary | ICD-10-CM | POA: Diagnosis not present

## 2013-09-19 DIAGNOSIS — E669 Obesity, unspecified: Secondary | ICD-10-CM | POA: Diagnosis not present

## 2013-10-12 DIAGNOSIS — J0110 Acute frontal sinusitis, unspecified: Secondary | ICD-10-CM | POA: Diagnosis not present

## 2013-10-30 DIAGNOSIS — N949 Unspecified condition associated with female genital organs and menstrual cycle: Secondary | ICD-10-CM | POA: Diagnosis not present

## 2013-10-30 DIAGNOSIS — N898 Other specified noninflammatory disorders of vagina: Secondary | ICD-10-CM | POA: Diagnosis not present

## 2013-11-02 DIAGNOSIS — N949 Unspecified condition associated with female genital organs and menstrual cycle: Secondary | ICD-10-CM | POA: Diagnosis not present

## 2013-11-12 DIAGNOSIS — I509 Heart failure, unspecified: Secondary | ICD-10-CM | POA: Diagnosis not present

## 2013-11-12 DIAGNOSIS — Z7982 Long term (current) use of aspirin: Secondary | ICD-10-CM | POA: Diagnosis not present

## 2013-11-12 DIAGNOSIS — I5030 Unspecified diastolic (congestive) heart failure: Secondary | ICD-10-CM | POA: Diagnosis not present

## 2013-11-12 DIAGNOSIS — J984 Other disorders of lung: Secondary | ICD-10-CM | POA: Diagnosis not present

## 2013-11-12 DIAGNOSIS — Z885 Allergy status to narcotic agent status: Secondary | ICD-10-CM | POA: Diagnosis not present

## 2013-11-12 DIAGNOSIS — E039 Hypothyroidism, unspecified: Secondary | ICD-10-CM | POA: Diagnosis not present

## 2013-11-12 DIAGNOSIS — Z6841 Body Mass Index (BMI) 40.0 and over, adult: Secondary | ICD-10-CM | POA: Diagnosis not present

## 2013-11-12 DIAGNOSIS — E119 Type 2 diabetes mellitus without complications: Secondary | ICD-10-CM | POA: Diagnosis not present

## 2013-11-12 DIAGNOSIS — Z883 Allergy status to other anti-infective agents status: Secondary | ICD-10-CM | POA: Diagnosis not present

## 2013-11-12 DIAGNOSIS — M1710 Unilateral primary osteoarthritis, unspecified knee: Secondary | ICD-10-CM | POA: Diagnosis not present

## 2013-11-12 DIAGNOSIS — E1169 Type 2 diabetes mellitus with other specified complication: Secondary | ICD-10-CM | POA: Diagnosis not present

## 2013-11-12 DIAGNOSIS — J441 Chronic obstructive pulmonary disease with (acute) exacerbation: Secondary | ICD-10-CM | POA: Diagnosis not present

## 2013-11-12 DIAGNOSIS — I10 Essential (primary) hypertension: Secondary | ICD-10-CM | POA: Diagnosis not present

## 2013-11-12 DIAGNOSIS — J45901 Unspecified asthma with (acute) exacerbation: Secondary | ICD-10-CM | POA: Diagnosis not present

## 2013-11-12 DIAGNOSIS — IMO0002 Reserved for concepts with insufficient information to code with codable children: Secondary | ICD-10-CM | POA: Diagnosis not present

## 2013-11-12 DIAGNOSIS — E785 Hyperlipidemia, unspecified: Secondary | ICD-10-CM | POA: Diagnosis not present

## 2013-11-12 DIAGNOSIS — Z794 Long term (current) use of insulin: Secondary | ICD-10-CM | POA: Diagnosis not present

## 2013-11-12 DIAGNOSIS — Z87891 Personal history of nicotine dependence: Secondary | ICD-10-CM | POA: Diagnosis not present

## 2013-11-12 DIAGNOSIS — Z79899 Other long term (current) drug therapy: Secondary | ICD-10-CM | POA: Diagnosis not present

## 2013-11-12 DIAGNOSIS — I2510 Atherosclerotic heart disease of native coronary artery without angina pectoris: Secondary | ICD-10-CM | POA: Diagnosis not present

## 2013-11-12 DIAGNOSIS — Z880 Allergy status to penicillin: Secondary | ICD-10-CM | POA: Diagnosis not present

## 2013-11-12 DIAGNOSIS — R509 Fever, unspecified: Secondary | ICD-10-CM | POA: Diagnosis not present

## 2013-11-13 DIAGNOSIS — R509 Fever, unspecified: Secondary | ICD-10-CM | POA: Diagnosis not present

## 2013-11-13 DIAGNOSIS — I2510 Atherosclerotic heart disease of native coronary artery without angina pectoris: Secondary | ICD-10-CM | POA: Diagnosis not present

## 2013-11-13 DIAGNOSIS — E039 Hypothyroidism, unspecified: Secondary | ICD-10-CM | POA: Diagnosis not present

## 2013-11-13 DIAGNOSIS — R069 Unspecified abnormalities of breathing: Secondary | ICD-10-CM | POA: Diagnosis not present

## 2013-11-13 DIAGNOSIS — J441 Chronic obstructive pulmonary disease with (acute) exacerbation: Secondary | ICD-10-CM | POA: Diagnosis not present

## 2013-11-13 DIAGNOSIS — I10 Essential (primary) hypertension: Secondary | ICD-10-CM | POA: Diagnosis not present

## 2013-11-13 DIAGNOSIS — R079 Chest pain, unspecified: Secondary | ICD-10-CM | POA: Diagnosis not present

## 2013-11-14 DIAGNOSIS — E039 Hypothyroidism, unspecified: Secondary | ICD-10-CM | POA: Diagnosis not present

## 2013-11-14 DIAGNOSIS — R509 Fever, unspecified: Secondary | ICD-10-CM | POA: Diagnosis not present

## 2013-11-14 DIAGNOSIS — J441 Chronic obstructive pulmonary disease with (acute) exacerbation: Secondary | ICD-10-CM | POA: Diagnosis not present

## 2013-11-14 DIAGNOSIS — I10 Essential (primary) hypertension: Secondary | ICD-10-CM | POA: Diagnosis not present

## 2013-11-14 DIAGNOSIS — I2510 Atherosclerotic heart disease of native coronary artery without angina pectoris: Secondary | ICD-10-CM | POA: Diagnosis not present

## 2013-12-01 DIAGNOSIS — H01009 Unspecified blepharitis unspecified eye, unspecified eyelid: Secondary | ICD-10-CM | POA: Diagnosis not present

## 2014-01-05 DIAGNOSIS — J301 Allergic rhinitis due to pollen: Secondary | ICD-10-CM | POA: Diagnosis not present

## 2014-01-19 ENCOUNTER — Other Ambulatory Visit: Payer: Self-pay | Admitting: Physician Assistant

## 2014-01-31 DIAGNOSIS — J984 Other disorders of lung: Secondary | ICD-10-CM | POA: Diagnosis not present

## 2014-01-31 DIAGNOSIS — I10 Essential (primary) hypertension: Secondary | ICD-10-CM | POA: Diagnosis not present

## 2014-01-31 DIAGNOSIS — E039 Hypothyroidism, unspecified: Secondary | ICD-10-CM | POA: Diagnosis not present

## 2014-01-31 DIAGNOSIS — Z79899 Other long term (current) drug therapy: Secondary | ICD-10-CM | POA: Diagnosis not present

## 2014-01-31 DIAGNOSIS — J441 Chronic obstructive pulmonary disease with (acute) exacerbation: Secondary | ICD-10-CM | POA: Diagnosis not present

## 2014-01-31 DIAGNOSIS — Z8249 Family history of ischemic heart disease and other diseases of the circulatory system: Secondary | ICD-10-CM | POA: Diagnosis not present

## 2014-01-31 DIAGNOSIS — Z833 Family history of diabetes mellitus: Secondary | ICD-10-CM | POA: Diagnosis not present

## 2014-01-31 DIAGNOSIS — Z7982 Long term (current) use of aspirin: Secondary | ICD-10-CM | POA: Diagnosis not present

## 2014-01-31 DIAGNOSIS — J45901 Unspecified asthma with (acute) exacerbation: Secondary | ICD-10-CM | POA: Diagnosis not present

## 2014-01-31 DIAGNOSIS — E7800 Pure hypercholesterolemia, unspecified: Secondary | ICD-10-CM | POA: Diagnosis not present

## 2014-01-31 DIAGNOSIS — Z794 Long term (current) use of insulin: Secondary | ICD-10-CM | POA: Diagnosis not present

## 2014-01-31 DIAGNOSIS — Z836 Family history of other diseases of the respiratory system: Secondary | ICD-10-CM | POA: Diagnosis not present

## 2014-01-31 DIAGNOSIS — K219 Gastro-esophageal reflux disease without esophagitis: Secondary | ICD-10-CM | POA: Diagnosis not present

## 2014-01-31 DIAGNOSIS — E119 Type 2 diabetes mellitus without complications: Secondary | ICD-10-CM | POA: Diagnosis not present

## 2014-02-20 ENCOUNTER — Telehealth: Payer: Self-pay | Admitting: *Deleted

## 2014-02-20 MED ORDER — TORSEMIDE 20 MG PO TABS: 20.0000 mg | ORAL_TABLET | Freq: Two times a day (BID) | ORAL | Status: DC

## 2014-02-20 NOTE — Telephone Encounter (Signed)
Torsemide 20 mg #60 f [phone]

## 2014-03-02 DIAGNOSIS — J449 Chronic obstructive pulmonary disease, unspecified: Secondary | ICD-10-CM | POA: Diagnosis not present

## 2014-03-02 DIAGNOSIS — I5030 Unspecified diastolic (congestive) heart failure: Secondary | ICD-10-CM | POA: Diagnosis not present

## 2014-03-02 DIAGNOSIS — I10 Essential (primary) hypertension: Secondary | ICD-10-CM | POA: Diagnosis not present

## 2014-03-02 DIAGNOSIS — Z7982 Long term (current) use of aspirin: Secondary | ICD-10-CM | POA: Diagnosis not present

## 2014-03-02 DIAGNOSIS — IMO0002 Reserved for concepts with insufficient information to code with codable children: Secondary | ICD-10-CM | POA: Diagnosis not present

## 2014-03-02 DIAGNOSIS — E119 Type 2 diabetes mellitus without complications: Secondary | ICD-10-CM | POA: Diagnosis not present

## 2014-03-02 DIAGNOSIS — E039 Hypothyroidism, unspecified: Secondary | ICD-10-CM | POA: Diagnosis not present

## 2014-03-02 DIAGNOSIS — Z880 Allergy status to penicillin: Secondary | ICD-10-CM | POA: Diagnosis not present

## 2014-03-02 DIAGNOSIS — E785 Hyperlipidemia, unspecified: Secondary | ICD-10-CM | POA: Diagnosis not present

## 2014-03-02 DIAGNOSIS — Z6841 Body Mass Index (BMI) 40.0 and over, adult: Secondary | ICD-10-CM | POA: Diagnosis not present

## 2014-03-02 DIAGNOSIS — I2510 Atherosclerotic heart disease of native coronary artery without angina pectoris: Secondary | ICD-10-CM | POA: Diagnosis not present

## 2014-03-02 DIAGNOSIS — R0789 Other chest pain: Secondary | ICD-10-CM | POA: Diagnosis not present

## 2014-03-02 DIAGNOSIS — Z79899 Other long term (current) drug therapy: Secondary | ICD-10-CM | POA: Diagnosis not present

## 2014-03-02 DIAGNOSIS — Z885 Allergy status to narcotic agent status: Secondary | ICD-10-CM | POA: Diagnosis not present

## 2014-03-02 DIAGNOSIS — M1710 Unilateral primary osteoarthritis, unspecified knee: Secondary | ICD-10-CM | POA: Diagnosis not present

## 2014-03-02 DIAGNOSIS — I509 Heart failure, unspecified: Secondary | ICD-10-CM | POA: Diagnosis not present

## 2014-03-02 DIAGNOSIS — Z794 Long term (current) use of insulin: Secondary | ICD-10-CM | POA: Diagnosis not present

## 2014-03-02 DIAGNOSIS — R0989 Other specified symptoms and signs involving the circulatory and respiratory systems: Secondary | ICD-10-CM | POA: Diagnosis not present

## 2014-03-02 DIAGNOSIS — R079 Chest pain, unspecified: Secondary | ICD-10-CM | POA: Diagnosis not present

## 2014-03-02 DIAGNOSIS — J441 Chronic obstructive pulmonary disease with (acute) exacerbation: Secondary | ICD-10-CM | POA: Diagnosis not present

## 2014-03-02 DIAGNOSIS — Z883 Allergy status to other anti-infective agents status: Secondary | ICD-10-CM | POA: Diagnosis not present

## 2014-03-02 DIAGNOSIS — K449 Diaphragmatic hernia without obstruction or gangrene: Secondary | ICD-10-CM | POA: Diagnosis not present

## 2014-03-02 DIAGNOSIS — Z9981 Dependence on supplemental oxygen: Secondary | ICD-10-CM | POA: Diagnosis not present

## 2014-03-03 DIAGNOSIS — J449 Chronic obstructive pulmonary disease, unspecified: Secondary | ICD-10-CM | POA: Diagnosis not present

## 2014-03-03 DIAGNOSIS — K449 Diaphragmatic hernia without obstruction or gangrene: Secondary | ICD-10-CM | POA: Diagnosis not present

## 2014-03-03 DIAGNOSIS — I10 Essential (primary) hypertension: Secondary | ICD-10-CM | POA: Diagnosis not present

## 2014-03-03 DIAGNOSIS — R079 Chest pain, unspecified: Secondary | ICD-10-CM | POA: Diagnosis not present

## 2014-03-03 DIAGNOSIS — I2510 Atherosclerotic heart disease of native coronary artery without angina pectoris: Secondary | ICD-10-CM | POA: Diagnosis not present

## 2014-03-29 DIAGNOSIS — J449 Chronic obstructive pulmonary disease, unspecified: Secondary | ICD-10-CM | POA: Diagnosis not present

## 2014-04-07 DIAGNOSIS — Z833 Family history of diabetes mellitus: Secondary | ICD-10-CM | POA: Diagnosis not present

## 2014-04-07 DIAGNOSIS — J069 Acute upper respiratory infection, unspecified: Secondary | ICD-10-CM | POA: Diagnosis not present

## 2014-04-07 DIAGNOSIS — I10 Essential (primary) hypertension: Secondary | ICD-10-CM | POA: Diagnosis not present

## 2014-04-07 DIAGNOSIS — E119 Type 2 diabetes mellitus without complications: Secondary | ICD-10-CM | POA: Diagnosis not present

## 2014-04-07 DIAGNOSIS — Z7982 Long term (current) use of aspirin: Secondary | ICD-10-CM | POA: Diagnosis not present

## 2014-04-07 DIAGNOSIS — K219 Gastro-esophageal reflux disease without esophagitis: Secondary | ICD-10-CM | POA: Diagnosis not present

## 2014-04-07 DIAGNOSIS — Z79899 Other long term (current) drug therapy: Secondary | ICD-10-CM | POA: Diagnosis not present

## 2014-04-07 DIAGNOSIS — Z794 Long term (current) use of insulin: Secondary | ICD-10-CM | POA: Diagnosis not present

## 2014-04-07 DIAGNOSIS — J449 Chronic obstructive pulmonary disease, unspecified: Secondary | ICD-10-CM | POA: Diagnosis not present

## 2014-04-07 DIAGNOSIS — R609 Edema, unspecified: Secondary | ICD-10-CM | POA: Diagnosis not present

## 2014-04-07 DIAGNOSIS — I509 Heart failure, unspecified: Secondary | ICD-10-CM | POA: Diagnosis not present

## 2014-04-07 DIAGNOSIS — Z836 Family history of other diseases of the respiratory system: Secondary | ICD-10-CM | POA: Diagnosis not present

## 2014-04-07 DIAGNOSIS — J438 Other emphysema: Secondary | ICD-10-CM | POA: Diagnosis not present

## 2014-04-07 DIAGNOSIS — E039 Hypothyroidism, unspecified: Secondary | ICD-10-CM | POA: Diagnosis not present

## 2014-04-07 DIAGNOSIS — E7800 Pure hypercholesterolemia, unspecified: Secondary | ICD-10-CM | POA: Diagnosis not present

## 2014-04-07 DIAGNOSIS — M129 Arthropathy, unspecified: Secondary | ICD-10-CM | POA: Diagnosis not present

## 2014-04-07 DIAGNOSIS — Z8249 Family history of ischemic heart disease and other diseases of the circulatory system: Secondary | ICD-10-CM | POA: Diagnosis not present

## 2014-04-09 DIAGNOSIS — Z136 Encounter for screening for cardiovascular disorders: Secondary | ICD-10-CM | POA: Diagnosis not present

## 2014-05-23 ENCOUNTER — Other Ambulatory Visit: Payer: Self-pay | Admitting: Cardiology

## 2014-05-31 DIAGNOSIS — J449 Chronic obstructive pulmonary disease, unspecified: Secondary | ICD-10-CM | POA: Diagnosis not present

## 2014-05-31 DIAGNOSIS — Z0000 Encounter for general adult medical examination without abnormal findings: Secondary | ICD-10-CM | POA: Diagnosis not present

## 2014-06-21 ENCOUNTER — Other Ambulatory Visit: Payer: Self-pay | Admitting: Cardiology

## 2014-06-22 ENCOUNTER — Other Ambulatory Visit: Payer: Self-pay | Admitting: *Deleted

## 2014-06-22 MED ORDER — TORSEMIDE 20 MG PO TABS: 20.0000 mg | ORAL_TABLET | Freq: Every day | ORAL | Status: DC

## 2014-07-03 DIAGNOSIS — Z1272 Encounter for screening for malignant neoplasm of vagina: Secondary | ICD-10-CM | POA: Diagnosis not present

## 2014-07-03 DIAGNOSIS — Z01419 Encounter for gynecological examination (general) (routine) without abnormal findings: Secondary | ICD-10-CM | POA: Diagnosis not present

## 2014-07-03 DIAGNOSIS — L293 Anogenital pruritus, unspecified: Secondary | ICD-10-CM | POA: Diagnosis not present

## 2014-07-08 ENCOUNTER — Encounter: Payer: Self-pay | Admitting: Cardiovascular Disease

## 2014-07-08 DIAGNOSIS — K5900 Constipation, unspecified: Secondary | ICD-10-CM | POA: Diagnosis not present

## 2014-07-08 DIAGNOSIS — IMO0002 Reserved for concepts with insufficient information to code with codable children: Secondary | ICD-10-CM | POA: Diagnosis present

## 2014-07-08 DIAGNOSIS — R079 Chest pain, unspecified: Secondary | ICD-10-CM | POA: Diagnosis not present

## 2014-07-08 DIAGNOSIS — Z886 Allergy status to analgesic agent status: Secondary | ICD-10-CM | POA: Diagnosis not present

## 2014-07-08 DIAGNOSIS — Z7982 Long term (current) use of aspirin: Secondary | ICD-10-CM | POA: Diagnosis not present

## 2014-07-08 DIAGNOSIS — Z87891 Personal history of nicotine dependence: Secondary | ICD-10-CM | POA: Diagnosis not present

## 2014-07-08 DIAGNOSIS — J441 Chronic obstructive pulmonary disease with (acute) exacerbation: Secondary | ICD-10-CM | POA: Diagnosis not present

## 2014-07-08 DIAGNOSIS — Z6841 Body Mass Index (BMI) 40.0 and over, adult: Secondary | ICD-10-CM | POA: Diagnosis not present

## 2014-07-08 DIAGNOSIS — J189 Pneumonia, unspecified organism: Secondary | ICD-10-CM | POA: Diagnosis not present

## 2014-07-08 DIAGNOSIS — R059 Cough, unspecified: Secondary | ICD-10-CM | POA: Diagnosis not present

## 2014-07-08 DIAGNOSIS — Z794 Long term (current) use of insulin: Secondary | ICD-10-CM | POA: Diagnosis not present

## 2014-07-08 DIAGNOSIS — Z9981 Dependence on supplemental oxygen: Secondary | ICD-10-CM | POA: Diagnosis not present

## 2014-07-08 DIAGNOSIS — I509 Heart failure, unspecified: Secondary | ICD-10-CM | POA: Diagnosis present

## 2014-07-08 DIAGNOSIS — Z880 Allergy status to penicillin: Secondary | ICD-10-CM | POA: Diagnosis not present

## 2014-07-08 DIAGNOSIS — R0902 Hypoxemia: Secondary | ICD-10-CM | POA: Diagnosis present

## 2014-07-08 DIAGNOSIS — Z79899 Other long term (current) drug therapy: Secondary | ICD-10-CM | POA: Diagnosis not present

## 2014-07-08 DIAGNOSIS — R0602 Shortness of breath: Secondary | ICD-10-CM | POA: Diagnosis not present

## 2014-07-08 DIAGNOSIS — J449 Chronic obstructive pulmonary disease, unspecified: Secondary | ICD-10-CM | POA: Diagnosis not present

## 2014-07-08 DIAGNOSIS — E039 Hypothyroidism, unspecified: Secondary | ICD-10-CM | POA: Diagnosis present

## 2014-07-08 DIAGNOSIS — J4489 Other specified chronic obstructive pulmonary disease: Secondary | ICD-10-CM | POA: Diagnosis not present

## 2014-07-08 DIAGNOSIS — J9819 Other pulmonary collapse: Secondary | ICD-10-CM | POA: Diagnosis not present

## 2014-07-08 DIAGNOSIS — R05 Cough: Secondary | ICD-10-CM | POA: Diagnosis not present

## 2014-07-08 DIAGNOSIS — I5032 Chronic diastolic (congestive) heart failure: Secondary | ICD-10-CM | POA: Diagnosis not present

## 2014-07-08 DIAGNOSIS — E119 Type 2 diabetes mellitus without complications: Secondary | ICD-10-CM | POA: Diagnosis present

## 2014-07-08 DIAGNOSIS — E7800 Pure hypercholesterolemia, unspecified: Secondary | ICD-10-CM | POA: Diagnosis present

## 2014-07-08 DIAGNOSIS — I10 Essential (primary) hypertension: Secondary | ICD-10-CM | POA: Diagnosis present

## 2014-07-08 DIAGNOSIS — Z883 Allergy status to other anti-infective agents status: Secondary | ICD-10-CM | POA: Diagnosis not present

## 2014-07-08 DIAGNOSIS — E785 Hyperlipidemia, unspecified: Secondary | ICD-10-CM | POA: Diagnosis present

## 2014-07-08 DIAGNOSIS — I2510 Atherosclerotic heart disease of native coronary artery without angina pectoris: Secondary | ICD-10-CM | POA: Diagnosis present

## 2014-07-09 DIAGNOSIS — J441 Chronic obstructive pulmonary disease with (acute) exacerbation: Secondary | ICD-10-CM | POA: Diagnosis not present

## 2014-07-09 DIAGNOSIS — I5032 Chronic diastolic (congestive) heart failure: Secondary | ICD-10-CM | POA: Diagnosis not present

## 2014-07-09 DIAGNOSIS — R0602 Shortness of breath: Secondary | ICD-10-CM | POA: Diagnosis not present

## 2014-07-09 DIAGNOSIS — J189 Pneumonia, unspecified organism: Secondary | ICD-10-CM | POA: Diagnosis not present

## 2014-07-09 DIAGNOSIS — Z6841 Body Mass Index (BMI) 40.0 and over, adult: Secondary | ICD-10-CM | POA: Diagnosis not present

## 2014-07-10 DIAGNOSIS — J441 Chronic obstructive pulmonary disease with (acute) exacerbation: Secondary | ICD-10-CM | POA: Diagnosis not present

## 2014-07-10 DIAGNOSIS — J189 Pneumonia, unspecified organism: Secondary | ICD-10-CM | POA: Diagnosis not present

## 2014-07-10 DIAGNOSIS — Z6841 Body Mass Index (BMI) 40.0 and over, adult: Secondary | ICD-10-CM | POA: Diagnosis not present

## 2014-07-10 DIAGNOSIS — I5032 Chronic diastolic (congestive) heart failure: Secondary | ICD-10-CM | POA: Diagnosis not present

## 2014-07-10 DIAGNOSIS — R0602 Shortness of breath: Secondary | ICD-10-CM | POA: Diagnosis not present

## 2014-08-11 ENCOUNTER — Encounter: Payer: Self-pay | Admitting: Cardiovascular Disease

## 2014-08-11 DIAGNOSIS — Z79899 Other long term (current) drug therapy: Secondary | ICD-10-CM | POA: Diagnosis not present

## 2014-08-11 DIAGNOSIS — R05 Cough: Secondary | ICD-10-CM | POA: Diagnosis not present

## 2014-08-11 DIAGNOSIS — R059 Cough, unspecified: Secondary | ICD-10-CM | POA: Diagnosis not present

## 2014-08-11 DIAGNOSIS — IMO0002 Reserved for concepts with insufficient information to code with codable children: Secondary | ICD-10-CM | POA: Diagnosis not present

## 2014-08-11 DIAGNOSIS — Z794 Long term (current) use of insulin: Secondary | ICD-10-CM | POA: Diagnosis not present

## 2014-08-11 DIAGNOSIS — Z7982 Long term (current) use of aspirin: Secondary | ICD-10-CM | POA: Diagnosis not present

## 2014-08-11 DIAGNOSIS — R0602 Shortness of breath: Secondary | ICD-10-CM | POA: Diagnosis not present

## 2014-08-11 DIAGNOSIS — I10 Essential (primary) hypertension: Secondary | ICD-10-CM | POA: Diagnosis not present

## 2014-08-11 DIAGNOSIS — Z96659 Presence of unspecified artificial knee joint: Secondary | ICD-10-CM | POA: Diagnosis not present

## 2014-08-11 DIAGNOSIS — K219 Gastro-esophageal reflux disease without esophagitis: Secondary | ICD-10-CM | POA: Diagnosis not present

## 2014-08-11 DIAGNOSIS — I517 Cardiomegaly: Secondary | ICD-10-CM | POA: Diagnosis not present

## 2014-08-11 DIAGNOSIS — J440 Chronic obstructive pulmonary disease with acute lower respiratory infection: Secondary | ICD-10-CM | POA: Diagnosis not present

## 2014-08-11 DIAGNOSIS — L538 Other specified erythematous conditions: Secondary | ICD-10-CM | POA: Diagnosis not present

## 2014-08-11 DIAGNOSIS — E119 Type 2 diabetes mellitus without complications: Secondary | ICD-10-CM | POA: Diagnosis not present

## 2014-08-11 DIAGNOSIS — E039 Hypothyroidism, unspecified: Secondary | ICD-10-CM | POA: Diagnosis not present

## 2014-08-11 DIAGNOSIS — J209 Acute bronchitis, unspecified: Secondary | ICD-10-CM | POA: Diagnosis not present

## 2014-08-11 DIAGNOSIS — R079 Chest pain, unspecified: Secondary | ICD-10-CM | POA: Diagnosis not present

## 2014-08-15 ENCOUNTER — Other Ambulatory Visit: Payer: Self-pay | Admitting: Cardiology

## 2014-08-22 ENCOUNTER — Ambulatory Visit: Payer: Medicare Other | Admitting: Cardiovascular Disease

## 2014-08-27 DIAGNOSIS — E1149 Type 2 diabetes mellitus with other diabetic neurological complication: Secondary | ICD-10-CM | POA: Diagnosis not present

## 2014-08-29 DIAGNOSIS — N760 Acute vaginitis: Secondary | ICD-10-CM | POA: Diagnosis not present

## 2014-09-03 DIAGNOSIS — Z23 Encounter for immunization: Secondary | ICD-10-CM | POA: Diagnosis not present

## 2014-09-05 DIAGNOSIS — Z883 Allergy status to other anti-infective agents status: Secondary | ICD-10-CM | POA: Diagnosis not present

## 2014-09-05 DIAGNOSIS — I10 Essential (primary) hypertension: Secondary | ICD-10-CM | POA: Diagnosis not present

## 2014-09-05 DIAGNOSIS — J9602 Acute respiratory failure with hypercapnia: Secondary | ICD-10-CM | POA: Diagnosis not present

## 2014-09-05 DIAGNOSIS — J811 Chronic pulmonary edema: Secondary | ICD-10-CM | POA: Diagnosis not present

## 2014-09-05 DIAGNOSIS — I517 Cardiomegaly: Secondary | ICD-10-CM | POA: Diagnosis not present

## 2014-09-05 DIAGNOSIS — E119 Type 2 diabetes mellitus without complications: Secondary | ICD-10-CM | POA: Diagnosis present

## 2014-09-05 DIAGNOSIS — I509 Heart failure, unspecified: Secondary | ICD-10-CM | POA: Diagnosis not present

## 2014-09-05 DIAGNOSIS — Z794 Long term (current) use of insulin: Secondary | ICD-10-CM | POA: Diagnosis not present

## 2014-09-05 DIAGNOSIS — N959 Unspecified menopausal and perimenopausal disorder: Secondary | ICD-10-CM | POA: Diagnosis present

## 2014-09-05 DIAGNOSIS — I2510 Atherosclerotic heart disease of native coronary artery without angina pectoris: Secondary | ICD-10-CM | POA: Diagnosis not present

## 2014-09-05 DIAGNOSIS — J45909 Unspecified asthma, uncomplicated: Secondary | ICD-10-CM | POA: Diagnosis present

## 2014-09-05 DIAGNOSIS — E780 Pure hypercholesterolemia: Secondary | ICD-10-CM | POA: Diagnosis present

## 2014-09-05 DIAGNOSIS — E872 Acidosis: Secondary | ICD-10-CM | POA: Diagnosis not present

## 2014-09-05 DIAGNOSIS — M170 Bilateral primary osteoarthritis of knee: Secondary | ICD-10-CM | POA: Diagnosis present

## 2014-09-05 DIAGNOSIS — G629 Polyneuropathy, unspecified: Secondary | ICD-10-CM | POA: Diagnosis present

## 2014-09-05 DIAGNOSIS — K449 Diaphragmatic hernia without obstruction or gangrene: Secondary | ICD-10-CM | POA: Diagnosis present

## 2014-09-05 DIAGNOSIS — Z886 Allergy status to analgesic agent status: Secondary | ICD-10-CM | POA: Diagnosis not present

## 2014-09-05 DIAGNOSIS — E785 Hyperlipidemia, unspecified: Secondary | ICD-10-CM | POA: Diagnosis present

## 2014-09-05 DIAGNOSIS — Z7951 Long term (current) use of inhaled steroids: Secondary | ICD-10-CM | POA: Diagnosis not present

## 2014-09-05 DIAGNOSIS — E039 Hypothyroidism, unspecified: Secondary | ICD-10-CM | POA: Diagnosis present

## 2014-09-05 DIAGNOSIS — R0602 Shortness of breath: Secondary | ICD-10-CM | POA: Diagnosis not present

## 2014-09-05 DIAGNOSIS — J441 Chronic obstructive pulmonary disease with (acute) exacerbation: Secondary | ICD-10-CM | POA: Diagnosis not present

## 2014-09-05 DIAGNOSIS — Z79899 Other long term (current) drug therapy: Secondary | ICD-10-CM | POA: Diagnosis not present

## 2014-09-05 DIAGNOSIS — Z880 Allergy status to penicillin: Secondary | ICD-10-CM | POA: Diagnosis not present

## 2014-09-05 DIAGNOSIS — Z7982 Long term (current) use of aspirin: Secondary | ICD-10-CM | POA: Diagnosis not present

## 2014-09-05 DIAGNOSIS — I5032 Chronic diastolic (congestive) heart failure: Secondary | ICD-10-CM | POA: Diagnosis not present

## 2014-09-06 DIAGNOSIS — I2510 Atherosclerotic heart disease of native coronary artery without angina pectoris: Secondary | ICD-10-CM | POA: Diagnosis not present

## 2014-09-06 DIAGNOSIS — J9602 Acute respiratory failure with hypercapnia: Secondary | ICD-10-CM | POA: Diagnosis not present

## 2014-09-06 DIAGNOSIS — J441 Chronic obstructive pulmonary disease with (acute) exacerbation: Secondary | ICD-10-CM | POA: Diagnosis not present

## 2014-09-06 DIAGNOSIS — I10 Essential (primary) hypertension: Secondary | ICD-10-CM | POA: Diagnosis not present

## 2014-09-06 DIAGNOSIS — I5032 Chronic diastolic (congestive) heart failure: Secondary | ICD-10-CM | POA: Diagnosis not present

## 2014-09-07 DIAGNOSIS — I5032 Chronic diastolic (congestive) heart failure: Secondary | ICD-10-CM | POA: Diagnosis not present

## 2014-09-07 DIAGNOSIS — I10 Essential (primary) hypertension: Secondary | ICD-10-CM | POA: Diagnosis not present

## 2014-09-07 DIAGNOSIS — I2510 Atherosclerotic heart disease of native coronary artery without angina pectoris: Secondary | ICD-10-CM | POA: Diagnosis not present

## 2014-09-07 DIAGNOSIS — J441 Chronic obstructive pulmonary disease with (acute) exacerbation: Secondary | ICD-10-CM | POA: Diagnosis not present

## 2014-09-07 DIAGNOSIS — J9602 Acute respiratory failure with hypercapnia: Secondary | ICD-10-CM | POA: Diagnosis not present

## 2014-09-19 ENCOUNTER — Other Ambulatory Visit: Payer: Self-pay | Admitting: Cardiology

## 2014-10-09 DIAGNOSIS — Z1231 Encounter for screening mammogram for malignant neoplasm of breast: Secondary | ICD-10-CM | POA: Diagnosis not present

## 2014-10-26 DIAGNOSIS — J45909 Unspecified asthma, uncomplicated: Secondary | ICD-10-CM | POA: Diagnosis not present

## 2014-10-26 DIAGNOSIS — Z880 Allergy status to penicillin: Secondary | ICD-10-CM | POA: Diagnosis not present

## 2014-10-26 DIAGNOSIS — Z1211 Encounter for screening for malignant neoplasm of colon: Secondary | ICD-10-CM | POA: Diagnosis not present

## 2014-10-26 DIAGNOSIS — Z800 Family history of malignant neoplasm of digestive organs: Secondary | ICD-10-CM | POA: Diagnosis not present

## 2014-10-26 DIAGNOSIS — I10 Essential (primary) hypertension: Secondary | ICD-10-CM | POA: Diagnosis not present

## 2014-10-26 DIAGNOSIS — E119 Type 2 diabetes mellitus without complications: Secondary | ICD-10-CM | POA: Diagnosis not present

## 2014-10-26 DIAGNOSIS — K635 Polyp of colon: Secondary | ICD-10-CM | POA: Diagnosis not present

## 2014-10-26 DIAGNOSIS — Z794 Long term (current) use of insulin: Secondary | ICD-10-CM | POA: Diagnosis not present

## 2014-10-26 DIAGNOSIS — D120 Benign neoplasm of cecum: Secondary | ICD-10-CM | POA: Diagnosis not present

## 2014-10-26 DIAGNOSIS — Z818 Family history of other mental and behavioral disorders: Secondary | ICD-10-CM | POA: Diagnosis not present

## 2014-10-26 DIAGNOSIS — J449 Chronic obstructive pulmonary disease, unspecified: Secondary | ICD-10-CM | POA: Diagnosis not present

## 2014-10-26 DIAGNOSIS — Z6841 Body Mass Index (BMI) 40.0 and over, adult: Secondary | ICD-10-CM | POA: Diagnosis not present

## 2014-10-26 DIAGNOSIS — Z79899 Other long term (current) drug therapy: Secondary | ICD-10-CM | POA: Diagnosis not present

## 2014-10-26 DIAGNOSIS — Z8489 Family history of other specified conditions: Secondary | ICD-10-CM | POA: Diagnosis not present

## 2014-10-29 DIAGNOSIS — D120 Benign neoplasm of cecum: Secondary | ICD-10-CM | POA: Diagnosis not present

## 2014-12-13 DIAGNOSIS — J810 Acute pulmonary edema: Secondary | ICD-10-CM | POA: Diagnosis not present

## 2014-12-13 DIAGNOSIS — I2510 Atherosclerotic heart disease of native coronary artery without angina pectoris: Secondary | ICD-10-CM | POA: Diagnosis present

## 2014-12-13 DIAGNOSIS — I517 Cardiomegaly: Secondary | ICD-10-CM | POA: Diagnosis not present

## 2014-12-13 DIAGNOSIS — E119 Type 2 diabetes mellitus without complications: Secondary | ICD-10-CM | POA: Diagnosis present

## 2014-12-13 DIAGNOSIS — E785 Hyperlipidemia, unspecified: Secondary | ICD-10-CM | POA: Diagnosis present

## 2014-12-13 DIAGNOSIS — S92411A Displaced fracture of proximal phalanx of right great toe, initial encounter for closed fracture: Secondary | ICD-10-CM | POA: Diagnosis not present

## 2014-12-13 DIAGNOSIS — S99911A Unspecified injury of right ankle, initial encounter: Secondary | ICD-10-CM | POA: Diagnosis not present

## 2014-12-13 DIAGNOSIS — Z886 Allergy status to analgesic agent status: Secondary | ICD-10-CM | POA: Diagnosis not present

## 2014-12-13 DIAGNOSIS — S92331A Displaced fracture of third metatarsal bone, right foot, initial encounter for closed fracture: Secondary | ICD-10-CM | POA: Diagnosis present

## 2014-12-13 DIAGNOSIS — K219 Gastro-esophageal reflux disease without esophagitis: Secondary | ICD-10-CM | POA: Diagnosis present

## 2014-12-13 DIAGNOSIS — S92511A Displaced fracture of proximal phalanx of right lesser toe(s), initial encounter for closed fracture: Secondary | ICD-10-CM | POA: Diagnosis not present

## 2014-12-13 DIAGNOSIS — S8991XA Unspecified injury of right lower leg, initial encounter: Secondary | ICD-10-CM | POA: Diagnosis not present

## 2014-12-13 DIAGNOSIS — M25561 Pain in right knee: Secondary | ICD-10-CM | POA: Diagnosis not present

## 2014-12-13 DIAGNOSIS — R0902 Hypoxemia: Secondary | ICD-10-CM | POA: Diagnosis not present

## 2014-12-13 DIAGNOSIS — I10 Essential (primary) hypertension: Secondary | ICD-10-CM | POA: Diagnosis present

## 2014-12-13 DIAGNOSIS — R52 Pain, unspecified: Secondary | ICD-10-CM | POA: Diagnosis not present

## 2014-12-13 DIAGNOSIS — Z7951 Long term (current) use of inhaled steroids: Secondary | ICD-10-CM | POA: Diagnosis not present

## 2014-12-13 DIAGNOSIS — M7989 Other specified soft tissue disorders: Secondary | ICD-10-CM | POA: Diagnosis not present

## 2014-12-13 DIAGNOSIS — Z79899 Other long term (current) drug therapy: Secondary | ICD-10-CM | POA: Diagnosis not present

## 2014-12-13 DIAGNOSIS — Y998 Other external cause status: Secondary | ICD-10-CM | POA: Diagnosis not present

## 2014-12-13 DIAGNOSIS — Z780 Asymptomatic menopausal state: Secondary | ICD-10-CM | POA: Diagnosis not present

## 2014-12-13 DIAGNOSIS — M25579 Pain in unspecified ankle and joints of unspecified foot: Secondary | ICD-10-CM | POA: Diagnosis not present

## 2014-12-13 DIAGNOSIS — Y9301 Activity, walking, marching and hiking: Secondary | ICD-10-CM | POA: Diagnosis not present

## 2014-12-13 DIAGNOSIS — W19XXXA Unspecified fall, initial encounter: Secondary | ICD-10-CM | POA: Diagnosis not present

## 2014-12-13 DIAGNOSIS — S92901A Unspecified fracture of right foot, initial encounter for closed fracture: Secondary | ICD-10-CM | POA: Diagnosis not present

## 2014-12-13 DIAGNOSIS — E873 Alkalosis: Secondary | ICD-10-CM | POA: Diagnosis not present

## 2014-12-13 DIAGNOSIS — J441 Chronic obstructive pulmonary disease with (acute) exacerbation: Secondary | ICD-10-CM | POA: Diagnosis not present

## 2014-12-13 DIAGNOSIS — M170 Bilateral primary osteoarthritis of knee: Secondary | ICD-10-CM | POA: Diagnosis present

## 2014-12-13 DIAGNOSIS — Z7982 Long term (current) use of aspirin: Secondary | ICD-10-CM | POA: Diagnosis not present

## 2014-12-13 DIAGNOSIS — I5032 Chronic diastolic (congestive) heart failure: Secondary | ICD-10-CM | POA: Diagnosis not present

## 2014-12-13 DIAGNOSIS — Z880 Allergy status to penicillin: Secondary | ICD-10-CM | POA: Diagnosis not present

## 2014-12-13 DIAGNOSIS — J45909 Unspecified asthma, uncomplicated: Secondary | ICD-10-CM | POA: Diagnosis present

## 2014-12-13 DIAGNOSIS — E039 Hypothyroidism, unspecified: Secondary | ICD-10-CM | POA: Diagnosis present

## 2014-12-13 DIAGNOSIS — Z794 Long term (current) use of insulin: Secondary | ICD-10-CM | POA: Diagnosis not present

## 2014-12-13 DIAGNOSIS — S92321A Displaced fracture of second metatarsal bone, right foot, initial encounter for closed fracture: Secondary | ICD-10-CM | POA: Diagnosis present

## 2014-12-13 DIAGNOSIS — M25571 Pain in right ankle and joints of right foot: Secondary | ICD-10-CM | POA: Diagnosis not present

## 2014-12-13 DIAGNOSIS — Z6841 Body Mass Index (BMI) 40.0 and over, adult: Secondary | ICD-10-CM | POA: Diagnosis not present

## 2014-12-27 DIAGNOSIS — E1149 Type 2 diabetes mellitus with other diabetic neurological complication: Secondary | ICD-10-CM | POA: Diagnosis not present

## 2014-12-27 DIAGNOSIS — I10 Essential (primary) hypertension: Secondary | ICD-10-CM | POA: Diagnosis not present

## 2014-12-27 DIAGNOSIS — J449 Chronic obstructive pulmonary disease, unspecified: Secondary | ICD-10-CM | POA: Diagnosis not present

## 2015-01-07 DIAGNOSIS — T1490 Injury, unspecified: Secondary | ICD-10-CM | POA: Diagnosis not present

## 2015-01-09 DIAGNOSIS — Z885 Allergy status to narcotic agent status: Secondary | ICD-10-CM | POA: Diagnosis not present

## 2015-01-09 DIAGNOSIS — J9601 Acute respiratory failure with hypoxia: Secondary | ICD-10-CM | POA: Diagnosis not present

## 2015-01-09 DIAGNOSIS — Z825 Family history of asthma and other chronic lower respiratory diseases: Secondary | ICD-10-CM | POA: Diagnosis not present

## 2015-01-09 DIAGNOSIS — Z833 Family history of diabetes mellitus: Secondary | ICD-10-CM | POA: Diagnosis not present

## 2015-01-09 DIAGNOSIS — K449 Diaphragmatic hernia without obstruction or gangrene: Secondary | ICD-10-CM | POA: Diagnosis not present

## 2015-01-09 DIAGNOSIS — I509 Heart failure, unspecified: Secondary | ICD-10-CM | POA: Diagnosis not present

## 2015-01-09 DIAGNOSIS — Z881 Allergy status to other antibiotic agents status: Secondary | ICD-10-CM | POA: Diagnosis not present

## 2015-01-09 DIAGNOSIS — Z7982 Long term (current) use of aspirin: Secondary | ICD-10-CM | POA: Diagnosis not present

## 2015-01-09 DIAGNOSIS — Z8249 Family history of ischemic heart disease and other diseases of the circulatory system: Secondary | ICD-10-CM | POA: Diagnosis not present

## 2015-01-09 DIAGNOSIS — K219 Gastro-esophageal reflux disease without esophagitis: Secondary | ICD-10-CM | POA: Diagnosis not present

## 2015-01-09 DIAGNOSIS — Z794 Long term (current) use of insulin: Secondary | ICD-10-CM | POA: Diagnosis not present

## 2015-01-09 DIAGNOSIS — R0602 Shortness of breath: Secondary | ICD-10-CM | POA: Diagnosis not present

## 2015-01-09 DIAGNOSIS — N959 Unspecified menopausal and perimenopausal disorder: Secondary | ICD-10-CM | POA: Diagnosis not present

## 2015-01-09 DIAGNOSIS — J9602 Acute respiratory failure with hypercapnia: Secondary | ICD-10-CM | POA: Diagnosis not present

## 2015-01-09 DIAGNOSIS — M170 Bilateral primary osteoarthritis of knee: Secondary | ICD-10-CM | POA: Diagnosis not present

## 2015-01-09 DIAGNOSIS — Z880 Allergy status to penicillin: Secondary | ICD-10-CM | POA: Diagnosis not present

## 2015-01-09 DIAGNOSIS — J441 Chronic obstructive pulmonary disease with (acute) exacerbation: Secondary | ICD-10-CM | POA: Diagnosis not present

## 2015-01-09 DIAGNOSIS — I2510 Atherosclerotic heart disease of native coronary artery without angina pectoris: Secondary | ICD-10-CM | POA: Diagnosis not present

## 2015-01-09 DIAGNOSIS — E1165 Type 2 diabetes mellitus with hyperglycemia: Secondary | ICD-10-CM | POA: Diagnosis not present

## 2015-01-09 DIAGNOSIS — Z7951 Long term (current) use of inhaled steroids: Secondary | ICD-10-CM | POA: Diagnosis not present

## 2015-01-09 DIAGNOSIS — E039 Hypothyroidism, unspecified: Secondary | ICD-10-CM | POA: Diagnosis not present

## 2015-01-09 DIAGNOSIS — I10 Essential (primary) hypertension: Secondary | ICD-10-CM | POA: Diagnosis not present

## 2015-01-09 DIAGNOSIS — I5032 Chronic diastolic (congestive) heart failure: Secondary | ICD-10-CM | POA: Diagnosis not present

## 2015-01-10 DIAGNOSIS — I2510 Atherosclerotic heart disease of native coronary artery without angina pectoris: Secondary | ICD-10-CM | POA: Diagnosis not present

## 2015-01-10 DIAGNOSIS — E1165 Type 2 diabetes mellitus with hyperglycemia: Secondary | ICD-10-CM | POA: Diagnosis not present

## 2015-01-10 DIAGNOSIS — J441 Chronic obstructive pulmonary disease with (acute) exacerbation: Secondary | ICD-10-CM | POA: Diagnosis not present

## 2015-01-10 DIAGNOSIS — I5032 Chronic diastolic (congestive) heart failure: Secondary | ICD-10-CM | POA: Diagnosis not present

## 2015-01-10 DIAGNOSIS — E039 Hypothyroidism, unspecified: Secondary | ICD-10-CM | POA: Diagnosis not present

## 2015-01-28 DIAGNOSIS — R194 Change in bowel habit: Secondary | ICD-10-CM | POA: Diagnosis not present

## 2015-02-16 DIAGNOSIS — Z794 Long term (current) use of insulin: Secondary | ICD-10-CM | POA: Diagnosis not present

## 2015-02-16 DIAGNOSIS — F17200 Nicotine dependence, unspecified, uncomplicated: Secondary | ICD-10-CM | POA: Diagnosis not present

## 2015-02-16 DIAGNOSIS — Z7951 Long term (current) use of inhaled steroids: Secondary | ICD-10-CM | POA: Diagnosis not present

## 2015-02-16 DIAGNOSIS — H9193 Unspecified hearing loss, bilateral: Secondary | ICD-10-CM | POA: Diagnosis not present

## 2015-02-16 DIAGNOSIS — K5900 Constipation, unspecified: Secondary | ICD-10-CM | POA: Diagnosis not present

## 2015-02-16 DIAGNOSIS — Z7982 Long term (current) use of aspirin: Secondary | ICD-10-CM | POA: Diagnosis not present

## 2015-02-16 DIAGNOSIS — K644 Residual hemorrhoidal skin tags: Secondary | ICD-10-CM | POA: Diagnosis not present

## 2015-02-16 DIAGNOSIS — E119 Type 2 diabetes mellitus without complications: Secondary | ICD-10-CM | POA: Diagnosis not present

## 2015-02-16 DIAGNOSIS — K625 Hemorrhage of anus and rectum: Secondary | ICD-10-CM | POA: Diagnosis not present

## 2015-02-16 DIAGNOSIS — J45909 Unspecified asthma, uncomplicated: Secondary | ICD-10-CM | POA: Diagnosis not present

## 2015-02-16 DIAGNOSIS — J449 Chronic obstructive pulmonary disease, unspecified: Secondary | ICD-10-CM | POA: Diagnosis not present

## 2015-02-16 DIAGNOSIS — Z79899 Other long term (current) drug therapy: Secondary | ICD-10-CM | POA: Diagnosis not present

## 2015-02-16 DIAGNOSIS — I10 Essential (primary) hypertension: Secondary | ICD-10-CM | POA: Diagnosis not present

## 2015-02-16 DIAGNOSIS — K6289 Other specified diseases of anus and rectum: Secondary | ICD-10-CM | POA: Diagnosis not present

## 2015-02-16 DIAGNOSIS — Z96653 Presence of artificial knee joint, bilateral: Secondary | ICD-10-CM | POA: Diagnosis not present

## 2015-02-25 DIAGNOSIS — I10 Essential (primary) hypertension: Secondary | ICD-10-CM | POA: Diagnosis present

## 2015-02-25 DIAGNOSIS — I509 Heart failure, unspecified: Secondary | ICD-10-CM | POA: Diagnosis not present

## 2015-02-25 DIAGNOSIS — R030 Elevated blood-pressure reading, without diagnosis of hypertension: Secondary | ICD-10-CM | POA: Diagnosis not present

## 2015-02-25 DIAGNOSIS — K449 Diaphragmatic hernia without obstruction or gangrene: Secondary | ICD-10-CM | POA: Diagnosis present

## 2015-02-25 DIAGNOSIS — J45909 Unspecified asthma, uncomplicated: Secondary | ICD-10-CM | POA: Diagnosis present

## 2015-02-25 DIAGNOSIS — S299XXA Unspecified injury of thorax, initial encounter: Secondary | ICD-10-CM | POA: Diagnosis not present

## 2015-02-25 DIAGNOSIS — I5032 Chronic diastolic (congestive) heart failure: Secondary | ICD-10-CM | POA: Diagnosis not present

## 2015-02-25 DIAGNOSIS — K644 Residual hemorrhoidal skin tags: Secondary | ICD-10-CM | POA: Diagnosis present

## 2015-02-25 DIAGNOSIS — J9612 Chronic respiratory failure with hypercapnia: Secondary | ICD-10-CM | POA: Diagnosis not present

## 2015-02-25 DIAGNOSIS — M170 Bilateral primary osteoarthritis of knee: Secondary | ICD-10-CM | POA: Diagnosis present

## 2015-02-25 DIAGNOSIS — W19XXXA Unspecified fall, initial encounter: Secondary | ICD-10-CM | POA: Diagnosis not present

## 2015-02-25 DIAGNOSIS — J9611 Chronic respiratory failure with hypoxia: Secondary | ICD-10-CM | POA: Diagnosis not present

## 2015-02-25 DIAGNOSIS — Z780 Asymptomatic menopausal state: Secondary | ICD-10-CM | POA: Diagnosis not present

## 2015-02-25 DIAGNOSIS — K219 Gastro-esophageal reflux disease without esophagitis: Secondary | ICD-10-CM | POA: Diagnosis present

## 2015-02-25 DIAGNOSIS — Z833 Family history of diabetes mellitus: Secondary | ICD-10-CM | POA: Diagnosis not present

## 2015-02-25 DIAGNOSIS — J449 Chronic obstructive pulmonary disease, unspecified: Secondary | ICD-10-CM | POA: Diagnosis not present

## 2015-02-25 DIAGNOSIS — S199XXA Unspecified injury of neck, initial encounter: Secondary | ICD-10-CM | POA: Diagnosis not present

## 2015-02-25 DIAGNOSIS — Z6841 Body Mass Index (BMI) 40.0 and over, adult: Secondary | ICD-10-CM | POA: Diagnosis not present

## 2015-02-25 DIAGNOSIS — S0990XA Unspecified injury of head, initial encounter: Secondary | ICD-10-CM | POA: Diagnosis not present

## 2015-02-25 DIAGNOSIS — S8991XA Unspecified injury of right lower leg, initial encounter: Secondary | ICD-10-CM | POA: Diagnosis not present

## 2015-02-25 DIAGNOSIS — R51 Headache: Secondary | ICD-10-CM | POA: Diagnosis not present

## 2015-02-25 DIAGNOSIS — Z7982 Long term (current) use of aspirin: Secondary | ICD-10-CM | POA: Diagnosis not present

## 2015-02-25 DIAGNOSIS — Z794 Long term (current) use of insulin: Secondary | ICD-10-CM | POA: Diagnosis not present

## 2015-02-25 DIAGNOSIS — M25561 Pain in right knee: Secondary | ICD-10-CM | POA: Diagnosis not present

## 2015-02-25 DIAGNOSIS — N390 Urinary tract infection, site not specified: Secondary | ICD-10-CM | POA: Diagnosis not present

## 2015-02-25 DIAGNOSIS — K922 Gastrointestinal hemorrhage, unspecified: Secondary | ICD-10-CM | POA: Diagnosis not present

## 2015-02-25 DIAGNOSIS — E785 Hyperlipidemia, unspecified: Secondary | ICD-10-CM | POA: Diagnosis present

## 2015-02-25 DIAGNOSIS — D120 Benign neoplasm of cecum: Secondary | ICD-10-CM | POA: Diagnosis present

## 2015-02-25 DIAGNOSIS — E119 Type 2 diabetes mellitus without complications: Secondary | ICD-10-CM | POA: Diagnosis present

## 2015-02-25 DIAGNOSIS — R42 Dizziness and giddiness: Secondary | ICD-10-CM | POA: Diagnosis not present

## 2015-02-25 DIAGNOSIS — M542 Cervicalgia: Secondary | ICD-10-CM | POA: Diagnosis not present

## 2015-02-25 DIAGNOSIS — Z7951 Long term (current) use of inhaled steroids: Secondary | ICD-10-CM | POA: Diagnosis not present

## 2015-02-25 DIAGNOSIS — M79604 Pain in right leg: Secondary | ICD-10-CM | POA: Diagnosis not present

## 2015-02-25 DIAGNOSIS — E039 Hypothyroidism, unspecified: Secondary | ICD-10-CM | POA: Diagnosis present

## 2015-02-25 DIAGNOSIS — E780 Pure hypercholesterolemia: Secondary | ICD-10-CM | POA: Diagnosis present

## 2015-02-25 DIAGNOSIS — I2510 Atherosclerotic heart disease of native coronary artery without angina pectoris: Secondary | ICD-10-CM | POA: Diagnosis present

## 2015-03-10 DIAGNOSIS — Z780 Asymptomatic menopausal state: Secondary | ICD-10-CM | POA: Diagnosis not present

## 2015-03-10 DIAGNOSIS — J449 Chronic obstructive pulmonary disease, unspecified: Secondary | ICD-10-CM | POA: Diagnosis not present

## 2015-03-10 DIAGNOSIS — Z7951 Long term (current) use of inhaled steroids: Secondary | ICD-10-CM | POA: Diagnosis not present

## 2015-03-10 DIAGNOSIS — R531 Weakness: Secondary | ICD-10-CM | POA: Diagnosis not present

## 2015-03-10 DIAGNOSIS — Z6841 Body Mass Index (BMI) 40.0 and over, adult: Secondary | ICD-10-CM | POA: Diagnosis not present

## 2015-03-10 DIAGNOSIS — M1990 Unspecified osteoarthritis, unspecified site: Secondary | ICD-10-CM | POA: Diagnosis not present

## 2015-03-10 DIAGNOSIS — E039 Hypothyroidism, unspecified: Secondary | ICD-10-CM | POA: Diagnosis not present

## 2015-03-10 DIAGNOSIS — E11649 Type 2 diabetes mellitus with hypoglycemia without coma: Secondary | ICD-10-CM | POA: Diagnosis not present

## 2015-03-10 DIAGNOSIS — Z79899 Other long term (current) drug therapy: Secondary | ICD-10-CM | POA: Diagnosis not present

## 2015-03-10 DIAGNOSIS — Z885 Allergy status to narcotic agent status: Secondary | ICD-10-CM | POA: Diagnosis not present

## 2015-03-10 DIAGNOSIS — Z794 Long term (current) use of insulin: Secondary | ICD-10-CM | POA: Diagnosis not present

## 2015-03-10 DIAGNOSIS — K922 Gastrointestinal hemorrhage, unspecified: Secondary | ICD-10-CM | POA: Diagnosis not present

## 2015-03-10 DIAGNOSIS — I2510 Atherosclerotic heart disease of native coronary artery without angina pectoris: Secondary | ICD-10-CM | POA: Diagnosis not present

## 2015-03-10 DIAGNOSIS — J45909 Unspecified asthma, uncomplicated: Secondary | ICD-10-CM | POA: Diagnosis not present

## 2015-03-10 DIAGNOSIS — E162 Hypoglycemia, unspecified: Secondary | ICD-10-CM | POA: Diagnosis not present

## 2015-03-10 DIAGNOSIS — R55 Syncope and collapse: Secondary | ICD-10-CM | POA: Diagnosis not present

## 2015-03-10 DIAGNOSIS — E785 Hyperlipidemia, unspecified: Secondary | ICD-10-CM | POA: Diagnosis not present

## 2015-03-10 DIAGNOSIS — Z880 Allergy status to penicillin: Secondary | ICD-10-CM | POA: Diagnosis not present

## 2015-03-10 DIAGNOSIS — R7309 Other abnormal glucose: Secondary | ICD-10-CM | POA: Diagnosis not present

## 2015-03-10 DIAGNOSIS — Z96651 Presence of right artificial knee joint: Secondary | ICD-10-CM | POA: Diagnosis not present

## 2015-03-10 DIAGNOSIS — R079 Chest pain, unspecified: Secondary | ICD-10-CM | POA: Diagnosis not present

## 2015-03-10 DIAGNOSIS — K219 Gastro-esophageal reflux disease without esophagitis: Secondary | ICD-10-CM | POA: Diagnosis not present

## 2015-03-10 DIAGNOSIS — I5042 Chronic combined systolic (congestive) and diastolic (congestive) heart failure: Secondary | ICD-10-CM | POA: Diagnosis not present

## 2015-03-10 DIAGNOSIS — J9610 Chronic respiratory failure, unspecified whether with hypoxia or hypercapnia: Secondary | ICD-10-CM | POA: Diagnosis not present

## 2015-03-10 DIAGNOSIS — Z883 Allergy status to other anti-infective agents status: Secondary | ICD-10-CM | POA: Diagnosis not present

## 2015-03-10 DIAGNOSIS — R404 Transient alteration of awareness: Secondary | ICD-10-CM | POA: Diagnosis not present

## 2015-03-10 DIAGNOSIS — I10 Essential (primary) hypertension: Secondary | ICD-10-CM | POA: Diagnosis not present

## 2015-03-10 DIAGNOSIS — M170 Bilateral primary osteoarthritis of knee: Secondary | ICD-10-CM | POA: Diagnosis not present

## 2015-03-10 DIAGNOSIS — R0602 Shortness of breath: Secondary | ICD-10-CM | POA: Diagnosis not present

## 2015-03-10 DIAGNOSIS — Z7982 Long term (current) use of aspirin: Secondary | ICD-10-CM | POA: Diagnosis not present

## 2015-03-10 DIAGNOSIS — Z8489 Family history of other specified conditions: Secondary | ICD-10-CM | POA: Diagnosis not present

## 2015-03-11 DIAGNOSIS — J9610 Chronic respiratory failure, unspecified whether with hypoxia or hypercapnia: Secondary | ICD-10-CM | POA: Diagnosis not present

## 2015-03-11 DIAGNOSIS — K922 Gastrointestinal hemorrhage, unspecified: Secondary | ICD-10-CM | POA: Diagnosis not present

## 2015-03-11 DIAGNOSIS — E11649 Type 2 diabetes mellitus with hypoglycemia without coma: Secondary | ICD-10-CM | POA: Diagnosis not present

## 2015-03-11 DIAGNOSIS — J449 Chronic obstructive pulmonary disease, unspecified: Secondary | ICD-10-CM | POA: Diagnosis not present

## 2015-03-11 DIAGNOSIS — I5042 Chronic combined systolic (congestive) and diastolic (congestive) heart failure: Secondary | ICD-10-CM | POA: Diagnosis not present

## 2015-03-27 DIAGNOSIS — R109 Unspecified abdominal pain: Secondary | ICD-10-CM | POA: Diagnosis not present

## 2015-03-27 DIAGNOSIS — Z86010 Personal history of colonic polyps: Secondary | ICD-10-CM | POA: Diagnosis not present

## 2015-03-27 DIAGNOSIS — K625 Hemorrhage of anus and rectum: Secondary | ICD-10-CM | POA: Diagnosis not present

## 2015-03-29 DIAGNOSIS — I10 Essential (primary) hypertension: Secondary | ICD-10-CM | POA: Diagnosis not present

## 2015-03-29 DIAGNOSIS — E1149 Type 2 diabetes mellitus with other diabetic neurological complication: Secondary | ICD-10-CM | POA: Diagnosis not present

## 2015-03-29 DIAGNOSIS — M545 Low back pain: Secondary | ICD-10-CM | POA: Diagnosis not present

## 2015-03-29 DIAGNOSIS — J449 Chronic obstructive pulmonary disease, unspecified: Secondary | ICD-10-CM | POA: Diagnosis not present

## 2015-04-01 DIAGNOSIS — R109 Unspecified abdominal pain: Secondary | ICD-10-CM | POA: Diagnosis not present

## 2015-04-01 DIAGNOSIS — K625 Hemorrhage of anus and rectum: Secondary | ICD-10-CM | POA: Diagnosis not present

## 2015-04-01 DIAGNOSIS — Z818 Family history of other mental and behavioral disorders: Secondary | ICD-10-CM | POA: Diagnosis not present

## 2015-04-01 DIAGNOSIS — Z800 Family history of malignant neoplasm of digestive organs: Secondary | ICD-10-CM | POA: Diagnosis not present

## 2015-04-01 DIAGNOSIS — E119 Type 2 diabetes mellitus without complications: Secondary | ICD-10-CM | POA: Diagnosis not present

## 2015-04-01 DIAGNOSIS — E669 Obesity, unspecified: Secondary | ICD-10-CM | POA: Diagnosis not present

## 2015-04-01 DIAGNOSIS — Z79899 Other long term (current) drug therapy: Secondary | ICD-10-CM | POA: Diagnosis not present

## 2015-04-01 DIAGNOSIS — I10 Essential (primary) hypertension: Secondary | ICD-10-CM | POA: Diagnosis not present

## 2015-04-01 DIAGNOSIS — Z96653 Presence of artificial knee joint, bilateral: Secondary | ICD-10-CM | POA: Diagnosis not present

## 2015-04-01 DIAGNOSIS — Z8489 Family history of other specified conditions: Secondary | ICD-10-CM | POA: Diagnosis not present

## 2015-04-01 DIAGNOSIS — Z880 Allergy status to penicillin: Secondary | ICD-10-CM | POA: Diagnosis not present

## 2015-04-01 DIAGNOSIS — D175 Benign lipomatous neoplasm of intra-abdominal organs: Secondary | ICD-10-CM | POA: Diagnosis not present

## 2015-04-01 DIAGNOSIS — Z6841 Body Mass Index (BMI) 40.0 and over, adult: Secondary | ICD-10-CM | POA: Diagnosis not present

## 2015-04-01 DIAGNOSIS — J45909 Unspecified asthma, uncomplicated: Secondary | ICD-10-CM | POA: Diagnosis not present

## 2015-04-01 DIAGNOSIS — K648 Other hemorrhoids: Secondary | ICD-10-CM | POA: Diagnosis not present

## 2015-04-01 DIAGNOSIS — G4730 Sleep apnea, unspecified: Secondary | ICD-10-CM | POA: Diagnosis not present

## 2015-04-01 DIAGNOSIS — Z794 Long term (current) use of insulin: Secondary | ICD-10-CM | POA: Diagnosis not present

## 2015-04-01 DIAGNOSIS — J449 Chronic obstructive pulmonary disease, unspecified: Secondary | ICD-10-CM | POA: Diagnosis not present

## 2015-04-01 DIAGNOSIS — Z86010 Personal history of colonic polyps: Secondary | ICD-10-CM | POA: Diagnosis not present

## 2015-04-16 DIAGNOSIS — K625 Hemorrhage of anus and rectum: Secondary | ICD-10-CM | POA: Diagnosis not present

## 2015-04-16 DIAGNOSIS — K648 Other hemorrhoids: Secondary | ICD-10-CM | POA: Diagnosis not present

## 2015-04-25 DIAGNOSIS — J449 Chronic obstructive pulmonary disease, unspecified: Secondary | ICD-10-CM | POA: Diagnosis not present

## 2015-06-01 DIAGNOSIS — Z7951 Long term (current) use of inhaled steroids: Secondary | ICD-10-CM | POA: Diagnosis not present

## 2015-06-01 DIAGNOSIS — Z8249 Family history of ischemic heart disease and other diseases of the circulatory system: Secondary | ICD-10-CM | POA: Diagnosis not present

## 2015-06-01 DIAGNOSIS — E039 Hypothyroidism, unspecified: Secondary | ICD-10-CM | POA: Diagnosis not present

## 2015-06-01 DIAGNOSIS — I10 Essential (primary) hypertension: Secondary | ICD-10-CM | POA: Diagnosis not present

## 2015-06-01 DIAGNOSIS — Z833 Family history of diabetes mellitus: Secondary | ICD-10-CM | POA: Diagnosis not present

## 2015-06-01 DIAGNOSIS — I509 Heart failure, unspecified: Secondary | ICD-10-CM | POA: Diagnosis not present

## 2015-06-01 DIAGNOSIS — E780 Pure hypercholesterolemia: Secondary | ICD-10-CM | POA: Diagnosis not present

## 2015-06-01 DIAGNOSIS — Z836 Family history of other diseases of the respiratory system: Secondary | ICD-10-CM | POA: Diagnosis not present

## 2015-06-01 DIAGNOSIS — I517 Cardiomegaly: Secondary | ICD-10-CM | POA: Diagnosis not present

## 2015-06-01 DIAGNOSIS — Z9981 Dependence on supplemental oxygen: Secondary | ICD-10-CM | POA: Diagnosis not present

## 2015-06-01 DIAGNOSIS — Z794 Long term (current) use of insulin: Secondary | ICD-10-CM | POA: Diagnosis not present

## 2015-06-01 DIAGNOSIS — R0602 Shortness of breath: Secondary | ICD-10-CM | POA: Diagnosis not present

## 2015-06-01 DIAGNOSIS — Z79899 Other long term (current) drug therapy: Secondary | ICD-10-CM | POA: Diagnosis not present

## 2015-06-01 DIAGNOSIS — Z825 Family history of asthma and other chronic lower respiratory diseases: Secondary | ICD-10-CM | POA: Diagnosis not present

## 2015-06-01 DIAGNOSIS — J449 Chronic obstructive pulmonary disease, unspecified: Secondary | ICD-10-CM | POA: Diagnosis not present

## 2015-06-01 DIAGNOSIS — J45901 Unspecified asthma with (acute) exacerbation: Secondary | ICD-10-CM | POA: Diagnosis not present

## 2015-06-01 DIAGNOSIS — E119 Type 2 diabetes mellitus without complications: Secondary | ICD-10-CM | POA: Diagnosis not present

## 2015-06-01 DIAGNOSIS — Z7982 Long term (current) use of aspirin: Secondary | ICD-10-CM | POA: Diagnosis not present

## 2015-06-01 DIAGNOSIS — J811 Chronic pulmonary edema: Secondary | ICD-10-CM | POA: Diagnosis not present

## 2015-06-01 DIAGNOSIS — K219 Gastro-esophageal reflux disease without esophagitis: Secondary | ICD-10-CM | POA: Diagnosis not present

## 2015-06-01 DIAGNOSIS — R0689 Other abnormalities of breathing: Secondary | ICD-10-CM | POA: Diagnosis not present

## 2015-06-04 DIAGNOSIS — R69 Illness, unspecified: Secondary | ICD-10-CM | POA: Diagnosis not present

## 2015-06-12 DIAGNOSIS — E119 Type 2 diabetes mellitus without complications: Secondary | ICD-10-CM | POA: Diagnosis not present

## 2015-06-12 DIAGNOSIS — R109 Unspecified abdominal pain: Secondary | ICD-10-CM | POA: Diagnosis not present

## 2015-06-12 DIAGNOSIS — R238 Other skin changes: Secondary | ICD-10-CM | POA: Diagnosis not present

## 2015-06-12 DIAGNOSIS — R140 Abdominal distension (gaseous): Secondary | ICD-10-CM | POA: Diagnosis not present

## 2015-06-14 DIAGNOSIS — I10 Essential (primary) hypertension: Secondary | ICD-10-CM | POA: Diagnosis not present

## 2015-06-14 DIAGNOSIS — J449 Chronic obstructive pulmonary disease, unspecified: Secondary | ICD-10-CM | POA: Diagnosis not present

## 2015-06-14 DIAGNOSIS — N3941 Urge incontinence: Secondary | ICD-10-CM | POA: Diagnosis not present

## 2015-06-27 DIAGNOSIS — R1909 Other intra-abdominal and pelvic swelling, mass and lump: Secondary | ICD-10-CM | POA: Diagnosis not present

## 2015-06-27 DIAGNOSIS — K5900 Constipation, unspecified: Secondary | ICD-10-CM | POA: Diagnosis not present

## 2015-06-27 DIAGNOSIS — N838 Other noninflammatory disorders of ovary, fallopian tube and broad ligament: Secondary | ICD-10-CM | POA: Diagnosis not present

## 2015-06-27 DIAGNOSIS — Z90710 Acquired absence of both cervix and uterus: Secondary | ICD-10-CM | POA: Diagnosis not present

## 2015-06-27 DIAGNOSIS — Q648 Other specified congenital malformations of urinary system: Secondary | ICD-10-CM | POA: Diagnosis not present

## 2015-06-28 DIAGNOSIS — E1165 Type 2 diabetes mellitus with hyperglycemia: Secondary | ICD-10-CM | POA: Diagnosis not present

## 2015-06-28 DIAGNOSIS — J449 Chronic obstructive pulmonary disease, unspecified: Secondary | ICD-10-CM | POA: Diagnosis not present

## 2015-06-29 DIAGNOSIS — R0789 Other chest pain: Secondary | ICD-10-CM | POA: Diagnosis not present

## 2015-06-29 DIAGNOSIS — Z7951 Long term (current) use of inhaled steroids: Secondary | ICD-10-CM | POA: Diagnosis not present

## 2015-06-29 DIAGNOSIS — R05 Cough: Secondary | ICD-10-CM | POA: Diagnosis not present

## 2015-06-29 DIAGNOSIS — J45909 Unspecified asthma, uncomplicated: Secondary | ICD-10-CM | POA: Diagnosis not present

## 2015-06-29 DIAGNOSIS — J449 Chronic obstructive pulmonary disease, unspecified: Secondary | ICD-10-CM | POA: Diagnosis not present

## 2015-06-29 DIAGNOSIS — E119 Type 2 diabetes mellitus without complications: Secondary | ICD-10-CM | POA: Diagnosis not present

## 2015-06-29 DIAGNOSIS — Z96653 Presence of artificial knee joint, bilateral: Secondary | ICD-10-CM | POA: Diagnosis not present

## 2015-06-29 DIAGNOSIS — Z7982 Long term (current) use of aspirin: Secondary | ICD-10-CM | POA: Diagnosis not present

## 2015-06-29 DIAGNOSIS — H1032 Unspecified acute conjunctivitis, left eye: Secondary | ICD-10-CM | POA: Diagnosis not present

## 2015-06-29 DIAGNOSIS — I10 Essential (primary) hypertension: Secondary | ICD-10-CM | POA: Diagnosis not present

## 2015-06-29 DIAGNOSIS — Z79899 Other long term (current) drug therapy: Secondary | ICD-10-CM | POA: Diagnosis not present

## 2015-06-29 DIAGNOSIS — J069 Acute upper respiratory infection, unspecified: Secondary | ICD-10-CM | POA: Diagnosis not present

## 2015-06-29 DIAGNOSIS — K219 Gastro-esophageal reflux disease without esophagitis: Secondary | ICD-10-CM | POA: Diagnosis not present

## 2015-06-29 DIAGNOSIS — Z794 Long term (current) use of insulin: Secondary | ICD-10-CM | POA: Diagnosis not present

## 2015-07-02 DIAGNOSIS — J440 Chronic obstructive pulmonary disease with acute lower respiratory infection: Secondary | ICD-10-CM | POA: Diagnosis not present

## 2015-07-02 DIAGNOSIS — Z1389 Encounter for screening for other disorder: Secondary | ICD-10-CM | POA: Diagnosis not present

## 2015-07-02 DIAGNOSIS — Z0000 Encounter for general adult medical examination without abnormal findings: Secondary | ICD-10-CM | POA: Diagnosis not present

## 2015-07-03 DIAGNOSIS — E1165 Type 2 diabetes mellitus with hyperglycemia: Secondary | ICD-10-CM | POA: Diagnosis not present

## 2015-07-03 DIAGNOSIS — J449 Chronic obstructive pulmonary disease, unspecified: Secondary | ICD-10-CM | POA: Diagnosis not present

## 2015-07-05 DIAGNOSIS — J449 Chronic obstructive pulmonary disease, unspecified: Secondary | ICD-10-CM | POA: Diagnosis not present

## 2015-07-05 DIAGNOSIS — E1165 Type 2 diabetes mellitus with hyperglycemia: Secondary | ICD-10-CM | POA: Diagnosis not present

## 2015-07-09 DIAGNOSIS — E1165 Type 2 diabetes mellitus with hyperglycemia: Secondary | ICD-10-CM | POA: Diagnosis not present

## 2015-07-09 DIAGNOSIS — J449 Chronic obstructive pulmonary disease, unspecified: Secondary | ICD-10-CM | POA: Diagnosis not present

## 2015-07-11 DIAGNOSIS — E1165 Type 2 diabetes mellitus with hyperglycemia: Secondary | ICD-10-CM | POA: Diagnosis not present

## 2015-07-11 DIAGNOSIS — J449 Chronic obstructive pulmonary disease, unspecified: Secondary | ICD-10-CM | POA: Diagnosis not present

## 2015-07-17 DIAGNOSIS — J449 Chronic obstructive pulmonary disease, unspecified: Secondary | ICD-10-CM | POA: Diagnosis not present

## 2015-07-17 DIAGNOSIS — E1165 Type 2 diabetes mellitus with hyperglycemia: Secondary | ICD-10-CM | POA: Diagnosis not present

## 2015-07-18 DIAGNOSIS — R1904 Left lower quadrant abdominal swelling, mass and lump: Secondary | ICD-10-CM | POA: Diagnosis not present

## 2015-07-18 DIAGNOSIS — E1165 Type 2 diabetes mellitus with hyperglycemia: Secondary | ICD-10-CM | POA: Diagnosis not present

## 2015-07-18 DIAGNOSIS — J449 Chronic obstructive pulmonary disease, unspecified: Secondary | ICD-10-CM | POA: Diagnosis not present

## 2015-07-22 DIAGNOSIS — E1165 Type 2 diabetes mellitus with hyperglycemia: Secondary | ICD-10-CM | POA: Diagnosis not present

## 2015-07-22 DIAGNOSIS — J449 Chronic obstructive pulmonary disease, unspecified: Secondary | ICD-10-CM | POA: Diagnosis not present

## 2015-07-24 DIAGNOSIS — J449 Chronic obstructive pulmonary disease, unspecified: Secondary | ICD-10-CM | POA: Diagnosis not present

## 2015-07-24 DIAGNOSIS — E1165 Type 2 diabetes mellitus with hyperglycemia: Secondary | ICD-10-CM | POA: Diagnosis not present

## 2015-07-30 DIAGNOSIS — Z7951 Long term (current) use of inhaled steroids: Secondary | ICD-10-CM | POA: Diagnosis not present

## 2015-07-30 DIAGNOSIS — E1165 Type 2 diabetes mellitus with hyperglycemia: Secondary | ICD-10-CM | POA: Diagnosis not present

## 2015-07-30 DIAGNOSIS — Z885 Allergy status to narcotic agent status: Secondary | ICD-10-CM | POA: Diagnosis not present

## 2015-07-30 DIAGNOSIS — J449 Chronic obstructive pulmonary disease, unspecified: Secondary | ICD-10-CM | POA: Diagnosis not present

## 2015-07-30 DIAGNOSIS — I10 Essential (primary) hypertension: Secondary | ICD-10-CM | POA: Diagnosis not present

## 2015-07-30 DIAGNOSIS — Z880 Allergy status to penicillin: Secondary | ICD-10-CM | POA: Diagnosis not present

## 2015-07-30 DIAGNOSIS — Z780 Asymptomatic menopausal state: Secondary | ICD-10-CM | POA: Diagnosis not present

## 2015-07-30 DIAGNOSIS — I2510 Atherosclerotic heart disease of native coronary artery without angina pectoris: Secondary | ICD-10-CM | POA: Diagnosis present

## 2015-07-30 DIAGNOSIS — Z881 Allergy status to other antibiotic agents status: Secondary | ICD-10-CM | POA: Diagnosis not present

## 2015-07-30 DIAGNOSIS — R0689 Other abnormalities of breathing: Secondary | ICD-10-CM | POA: Diagnosis not present

## 2015-07-30 DIAGNOSIS — J9612 Chronic respiratory failure with hypercapnia: Secondary | ICD-10-CM | POA: Diagnosis not present

## 2015-07-30 DIAGNOSIS — E119 Type 2 diabetes mellitus without complications: Secondary | ICD-10-CM | POA: Diagnosis present

## 2015-07-30 DIAGNOSIS — Z7982 Long term (current) use of aspirin: Secondary | ICD-10-CM | POA: Diagnosis not present

## 2015-07-30 DIAGNOSIS — Z6841 Body Mass Index (BMI) 40.0 and over, adult: Secondary | ICD-10-CM | POA: Diagnosis not present

## 2015-07-30 DIAGNOSIS — E039 Hypothyroidism, unspecified: Secondary | ICD-10-CM | POA: Diagnosis present

## 2015-07-30 DIAGNOSIS — E873 Alkalosis: Secondary | ICD-10-CM | POA: Diagnosis not present

## 2015-07-30 DIAGNOSIS — Z79899 Other long term (current) drug therapy: Secondary | ICD-10-CM | POA: Diagnosis not present

## 2015-07-30 DIAGNOSIS — R05 Cough: Secondary | ICD-10-CM | POA: Diagnosis not present

## 2015-07-30 DIAGNOSIS — M1990 Unspecified osteoarthritis, unspecified site: Secondary | ICD-10-CM | POA: Diagnosis present

## 2015-07-30 DIAGNOSIS — I5032 Chronic diastolic (congestive) heart failure: Secondary | ICD-10-CM | POA: Diagnosis not present

## 2015-07-30 DIAGNOSIS — M170 Bilateral primary osteoarthritis of knee: Secondary | ICD-10-CM | POA: Diagnosis present

## 2015-07-30 DIAGNOSIS — J189 Pneumonia, unspecified organism: Secondary | ICD-10-CM | POA: Diagnosis not present

## 2015-08-03 DIAGNOSIS — E1165 Type 2 diabetes mellitus with hyperglycemia: Secondary | ICD-10-CM | POA: Diagnosis not present

## 2015-08-03 DIAGNOSIS — J449 Chronic obstructive pulmonary disease, unspecified: Secondary | ICD-10-CM | POA: Diagnosis not present

## 2015-08-10 DIAGNOSIS — E161 Other hypoglycemia: Secondary | ICD-10-CM | POA: Diagnosis not present

## 2015-08-10 DIAGNOSIS — R7309 Other abnormal glucose: Secondary | ICD-10-CM | POA: Diagnosis not present

## 2015-08-12 DIAGNOSIS — E1165 Type 2 diabetes mellitus with hyperglycemia: Secondary | ICD-10-CM | POA: Diagnosis not present

## 2015-08-12 DIAGNOSIS — J449 Chronic obstructive pulmonary disease, unspecified: Secondary | ICD-10-CM | POA: Diagnosis not present

## 2015-08-14 ENCOUNTER — Other Ambulatory Visit: Payer: Self-pay | Admitting: Unknown Physician Specialty

## 2015-08-14 DIAGNOSIS — Z794 Long term (current) use of insulin: Secondary | ICD-10-CM | POA: Diagnosis not present

## 2015-08-14 DIAGNOSIS — I5032 Chronic diastolic (congestive) heart failure: Secondary | ICD-10-CM | POA: Diagnosis not present

## 2015-08-14 DIAGNOSIS — Z7982 Long term (current) use of aspirin: Secondary | ICD-10-CM | POA: Diagnosis not present

## 2015-08-14 DIAGNOSIS — I517 Cardiomegaly: Secondary | ICD-10-CM | POA: Diagnosis not present

## 2015-08-14 DIAGNOSIS — Z9109 Other allergy status, other than to drugs and biological substances: Secondary | ICD-10-CM | POA: Diagnosis not present

## 2015-08-14 DIAGNOSIS — E1165 Type 2 diabetes mellitus with hyperglycemia: Secondary | ICD-10-CM | POA: Diagnosis not present

## 2015-08-14 DIAGNOSIS — Z79899 Other long term (current) drug therapy: Secondary | ICD-10-CM | POA: Diagnosis not present

## 2015-08-14 DIAGNOSIS — E872 Acidosis: Secondary | ICD-10-CM | POA: Diagnosis not present

## 2015-08-14 DIAGNOSIS — R7309 Other abnormal glucose: Secondary | ICD-10-CM | POA: Diagnosis not present

## 2015-08-14 DIAGNOSIS — R1907 Generalized intra-abdominal and pelvic swelling, mass and lump: Secondary | ICD-10-CM

## 2015-08-14 DIAGNOSIS — I10 Essential (primary) hypertension: Secondary | ICD-10-CM | POA: Diagnosis not present

## 2015-08-14 DIAGNOSIS — E161 Other hypoglycemia: Secondary | ICD-10-CM | POA: Diagnosis not present

## 2015-08-14 DIAGNOSIS — J189 Pneumonia, unspecified organism: Secondary | ICD-10-CM | POA: Diagnosis not present

## 2015-08-14 DIAGNOSIS — E039 Hypothyroidism, unspecified: Secondary | ICD-10-CM | POA: Diagnosis not present

## 2015-08-14 DIAGNOSIS — Z79891 Long term (current) use of opiate analgesic: Secondary | ICD-10-CM | POA: Diagnosis not present

## 2015-08-14 DIAGNOSIS — Z7952 Long term (current) use of systemic steroids: Secondary | ICD-10-CM | POA: Diagnosis not present

## 2015-08-14 DIAGNOSIS — J9612 Chronic respiratory failure with hypercapnia: Secondary | ICD-10-CM | POA: Diagnosis not present

## 2015-08-14 DIAGNOSIS — I2510 Atherosclerotic heart disease of native coronary artery without angina pectoris: Secondary | ICD-10-CM | POA: Diagnosis not present

## 2015-08-14 DIAGNOSIS — Z780 Asymptomatic menopausal state: Secondary | ICD-10-CM | POA: Diagnosis not present

## 2015-08-14 DIAGNOSIS — Z880 Allergy status to penicillin: Secondary | ICD-10-CM | POA: Diagnosis not present

## 2015-08-14 DIAGNOSIS — M1990 Unspecified osteoarthritis, unspecified site: Secondary | ICD-10-CM | POA: Diagnosis not present

## 2015-08-14 DIAGNOSIS — Z885 Allergy status to narcotic agent status: Secondary | ICD-10-CM | POA: Diagnosis not present

## 2015-08-14 DIAGNOSIS — E11649 Type 2 diabetes mellitus with hypoglycemia without coma: Secondary | ICD-10-CM | POA: Diagnosis not present

## 2015-08-15 DIAGNOSIS — E872 Acidosis: Secondary | ICD-10-CM | POA: Diagnosis not present

## 2015-08-15 DIAGNOSIS — J9612 Chronic respiratory failure with hypercapnia: Secondary | ICD-10-CM | POA: Diagnosis not present

## 2015-08-15 DIAGNOSIS — I5032 Chronic diastolic (congestive) heart failure: Secondary | ICD-10-CM | POA: Diagnosis not present

## 2015-08-15 DIAGNOSIS — E1165 Type 2 diabetes mellitus with hyperglycemia: Secondary | ICD-10-CM | POA: Diagnosis not present

## 2015-08-15 DIAGNOSIS — I10 Essential (primary) hypertension: Secondary | ICD-10-CM | POA: Diagnosis not present

## 2015-08-16 DIAGNOSIS — I5032 Chronic diastolic (congestive) heart failure: Secondary | ICD-10-CM | POA: Diagnosis not present

## 2015-08-16 DIAGNOSIS — E1165 Type 2 diabetes mellitus with hyperglycemia: Secondary | ICD-10-CM | POA: Diagnosis not present

## 2015-08-16 DIAGNOSIS — J9612 Chronic respiratory failure with hypercapnia: Secondary | ICD-10-CM | POA: Diagnosis not present

## 2015-08-16 DIAGNOSIS — I10 Essential (primary) hypertension: Secondary | ICD-10-CM | POA: Diagnosis not present

## 2015-08-16 DIAGNOSIS — E872 Acidosis: Secondary | ICD-10-CM | POA: Diagnosis not present

## 2015-09-09 DIAGNOSIS — E119 Type 2 diabetes mellitus without complications: Secondary | ICD-10-CM | POA: Diagnosis not present

## 2015-09-09 DIAGNOSIS — E782 Mixed hyperlipidemia: Secondary | ICD-10-CM | POA: Diagnosis not present

## 2015-09-09 DIAGNOSIS — B308 Other viral conjunctivitis: Secondary | ICD-10-CM | POA: Diagnosis not present

## 2015-09-09 DIAGNOSIS — J440 Chronic obstructive pulmonary disease with acute lower respiratory infection: Secondary | ICD-10-CM | POA: Diagnosis not present

## 2015-09-09 DIAGNOSIS — I10 Essential (primary) hypertension: Secondary | ICD-10-CM | POA: Diagnosis not present

## 2015-10-15 DIAGNOSIS — K648 Other hemorrhoids: Secondary | ICD-10-CM | POA: Diagnosis not present

## 2015-11-16 DIAGNOSIS — R69 Illness, unspecified: Secondary | ICD-10-CM | POA: Diagnosis not present

## 2015-11-25 DIAGNOSIS — J9611 Chronic respiratory failure with hypoxia: Secondary | ICD-10-CM | POA: Diagnosis not present

## 2015-11-25 DIAGNOSIS — Z794 Long term (current) use of insulin: Secondary | ICD-10-CM | POA: Diagnosis not present

## 2015-11-25 DIAGNOSIS — R4781 Slurred speech: Secondary | ICD-10-CM | POA: Diagnosis not present

## 2015-11-25 DIAGNOSIS — G458 Other transient cerebral ischemic attacks and related syndromes: Secondary | ICD-10-CM | POA: Diagnosis not present

## 2015-11-25 DIAGNOSIS — J9610 Chronic respiratory failure, unspecified whether with hypoxia or hypercapnia: Secondary | ICD-10-CM | POA: Diagnosis not present

## 2015-11-25 DIAGNOSIS — R531 Weakness: Secondary | ICD-10-CM | POA: Diagnosis not present

## 2015-11-25 DIAGNOSIS — Z780 Asymptomatic menopausal state: Secondary | ICD-10-CM | POA: Diagnosis not present

## 2015-11-25 DIAGNOSIS — I5032 Chronic diastolic (congestive) heart failure: Secondary | ICD-10-CM | POA: Diagnosis not present

## 2015-11-25 DIAGNOSIS — W19XXXA Unspecified fall, initial encounter: Secondary | ICD-10-CM | POA: Diagnosis not present

## 2015-11-25 DIAGNOSIS — Z841 Family history of disorders of kidney and ureter: Secondary | ICD-10-CM | POA: Diagnosis not present

## 2015-11-25 DIAGNOSIS — Z6841 Body Mass Index (BMI) 40.0 and over, adult: Secondary | ICD-10-CM | POA: Diagnosis not present

## 2015-11-25 DIAGNOSIS — Z880 Allergy status to penicillin: Secondary | ICD-10-CM | POA: Diagnosis not present

## 2015-11-25 DIAGNOSIS — E119 Type 2 diabetes mellitus without complications: Secondary | ICD-10-CM | POA: Diagnosis not present

## 2015-11-25 DIAGNOSIS — R739 Hyperglycemia, unspecified: Secondary | ICD-10-CM | POA: Diagnosis not present

## 2015-11-25 DIAGNOSIS — L03116 Cellulitis of left lower limb: Secondary | ICD-10-CM | POA: Diagnosis not present

## 2015-11-25 DIAGNOSIS — Z8489 Family history of other specified conditions: Secondary | ICD-10-CM | POA: Diagnosis not present

## 2015-11-25 DIAGNOSIS — Z23 Encounter for immunization: Secondary | ICD-10-CM | POA: Diagnosis not present

## 2015-11-25 DIAGNOSIS — I2510 Atherosclerotic heart disease of native coronary artery without angina pectoris: Secondary | ICD-10-CM | POA: Diagnosis not present

## 2015-11-25 DIAGNOSIS — J441 Chronic obstructive pulmonary disease with (acute) exacerbation: Secondary | ICD-10-CM | POA: Diagnosis not present

## 2015-11-25 DIAGNOSIS — J9612 Chronic respiratory failure with hypercapnia: Secondary | ICD-10-CM | POA: Diagnosis not present

## 2015-11-25 DIAGNOSIS — Z79899 Other long term (current) drug therapy: Secondary | ICD-10-CM | POA: Diagnosis not present

## 2015-11-25 DIAGNOSIS — Z885 Allergy status to narcotic agent status: Secondary | ICD-10-CM | POA: Diagnosis not present

## 2015-11-25 DIAGNOSIS — M1990 Unspecified osteoarthritis, unspecified site: Secondary | ICD-10-CM | POA: Diagnosis not present

## 2015-11-25 DIAGNOSIS — R404 Transient alteration of awareness: Secondary | ICD-10-CM | POA: Diagnosis not present

## 2015-11-25 DIAGNOSIS — G459 Transient cerebral ischemic attack, unspecified: Secondary | ICD-10-CM | POA: Diagnosis not present

## 2015-11-25 DIAGNOSIS — E039 Hypothyroidism, unspecified: Secondary | ICD-10-CM | POA: Diagnosis not present

## 2015-11-25 DIAGNOSIS — Z881 Allergy status to other antibiotic agents status: Secondary | ICD-10-CM | POA: Diagnosis not present

## 2015-11-25 DIAGNOSIS — I10 Essential (primary) hypertension: Secondary | ICD-10-CM | POA: Diagnosis not present

## 2015-11-25 DIAGNOSIS — J449 Chronic obstructive pulmonary disease, unspecified: Secondary | ICD-10-CM | POA: Diagnosis not present

## 2015-11-26 DIAGNOSIS — J9611 Chronic respiratory failure with hypoxia: Secondary | ICD-10-CM | POA: Diagnosis not present

## 2015-11-26 DIAGNOSIS — I5032 Chronic diastolic (congestive) heart failure: Secondary | ICD-10-CM | POA: Diagnosis not present

## 2015-11-26 DIAGNOSIS — L03116 Cellulitis of left lower limb: Secondary | ICD-10-CM | POA: Diagnosis not present

## 2015-11-26 DIAGNOSIS — J9612 Chronic respiratory failure with hypercapnia: Secondary | ICD-10-CM | POA: Diagnosis not present

## 2015-11-26 DIAGNOSIS — J441 Chronic obstructive pulmonary disease with (acute) exacerbation: Secondary | ICD-10-CM | POA: Diagnosis not present

## 2015-11-26 DIAGNOSIS — G458 Other transient cerebral ischemic attacks and related syndromes: Secondary | ICD-10-CM | POA: Diagnosis not present

## 2015-11-27 DIAGNOSIS — G458 Other transient cerebral ischemic attacks and related syndromes: Secondary | ICD-10-CM | POA: Diagnosis not present

## 2015-11-27 DIAGNOSIS — T148 Other injury of unspecified body region: Secondary | ICD-10-CM | POA: Diagnosis not present

## 2015-11-27 DIAGNOSIS — L03116 Cellulitis of left lower limb: Secondary | ICD-10-CM | POA: Diagnosis not present

## 2015-11-27 DIAGNOSIS — J441 Chronic obstructive pulmonary disease with (acute) exacerbation: Secondary | ICD-10-CM | POA: Diagnosis not present

## 2015-11-27 DIAGNOSIS — J9612 Chronic respiratory failure with hypercapnia: Secondary | ICD-10-CM | POA: Diagnosis not present

## 2015-11-27 DIAGNOSIS — T1490 Injury, unspecified: Secondary | ICD-10-CM | POA: Diagnosis not present

## 2015-11-27 DIAGNOSIS — R69 Illness, unspecified: Secondary | ICD-10-CM | POA: Diagnosis not present

## 2015-11-27 DIAGNOSIS — J9611 Chronic respiratory failure with hypoxia: Secondary | ICD-10-CM | POA: Diagnosis not present

## 2015-11-27 DIAGNOSIS — I5032 Chronic diastolic (congestive) heart failure: Secondary | ICD-10-CM | POA: Diagnosis not present

## 2015-11-30 DIAGNOSIS — R1011 Right upper quadrant pain: Secondary | ICD-10-CM | POA: Diagnosis not present

## 2015-11-30 DIAGNOSIS — Z881 Allergy status to other antibiotic agents status: Secondary | ICD-10-CM | POA: Diagnosis not present

## 2015-11-30 DIAGNOSIS — I509 Heart failure, unspecified: Secondary | ICD-10-CM | POA: Diagnosis not present

## 2015-11-30 DIAGNOSIS — K567 Ileus, unspecified: Secondary | ICD-10-CM | POA: Diagnosis not present

## 2015-11-30 DIAGNOSIS — K5900 Constipation, unspecified: Secondary | ICD-10-CM | POA: Diagnosis not present

## 2015-11-30 DIAGNOSIS — R112 Nausea with vomiting, unspecified: Secondary | ICD-10-CM | POA: Diagnosis not present

## 2015-11-30 DIAGNOSIS — R198 Other specified symptoms and signs involving the digestive system and abdomen: Secondary | ICD-10-CM | POA: Diagnosis not present

## 2015-11-30 DIAGNOSIS — Z880 Allergy status to penicillin: Secondary | ICD-10-CM | POA: Diagnosis not present

## 2015-11-30 DIAGNOSIS — Z7401 Bed confinement status: Secondary | ICD-10-CM | POA: Diagnosis not present

## 2015-11-30 DIAGNOSIS — Z9981 Dependence on supplemental oxygen: Secondary | ICD-10-CM | POA: Diagnosis not present

## 2015-11-30 DIAGNOSIS — Z885 Allergy status to narcotic agent status: Secondary | ICD-10-CM | POA: Diagnosis not present

## 2015-11-30 DIAGNOSIS — R69 Illness, unspecified: Secondary | ICD-10-CM | POA: Diagnosis not present

## 2015-11-30 DIAGNOSIS — R109 Unspecified abdominal pain: Secondary | ICD-10-CM | POA: Diagnosis not present

## 2015-11-30 DIAGNOSIS — E7800 Pure hypercholesterolemia, unspecified: Secondary | ICD-10-CM | POA: Diagnosis not present

## 2015-11-30 DIAGNOSIS — J449 Chronic obstructive pulmonary disease, unspecified: Secondary | ICD-10-CM | POA: Diagnosis not present

## 2015-11-30 DIAGNOSIS — I10 Essential (primary) hypertension: Secondary | ICD-10-CM | POA: Diagnosis not present

## 2015-11-30 DIAGNOSIS — E119 Type 2 diabetes mellitus without complications: Secondary | ICD-10-CM | POA: Diagnosis not present

## 2015-12-04 DIAGNOSIS — Z794 Long term (current) use of insulin: Secondary | ICD-10-CM | POA: Diagnosis not present

## 2015-12-04 DIAGNOSIS — J441 Chronic obstructive pulmonary disease with (acute) exacerbation: Secondary | ICD-10-CM | POA: Diagnosis not present

## 2015-12-04 DIAGNOSIS — I5030 Unspecified diastolic (congestive) heart failure: Secondary | ICD-10-CM | POA: Diagnosis not present

## 2015-12-04 DIAGNOSIS — J9612 Chronic respiratory failure with hypercapnia: Secondary | ICD-10-CM | POA: Diagnosis not present

## 2015-12-04 DIAGNOSIS — J9611 Chronic respiratory failure with hypoxia: Secondary | ICD-10-CM | POA: Diagnosis not present

## 2015-12-04 DIAGNOSIS — I2510 Atherosclerotic heart disease of native coronary artery without angina pectoris: Secondary | ICD-10-CM | POA: Diagnosis not present

## 2015-12-04 DIAGNOSIS — M1990 Unspecified osteoarthritis, unspecified site: Secondary | ICD-10-CM | POA: Diagnosis not present

## 2015-12-04 DIAGNOSIS — Z9981 Dependence on supplemental oxygen: Secondary | ICD-10-CM | POA: Diagnosis not present

## 2015-12-04 DIAGNOSIS — E119 Type 2 diabetes mellitus without complications: Secondary | ICD-10-CM | POA: Diagnosis not present

## 2015-12-04 DIAGNOSIS — J9610 Chronic respiratory failure, unspecified whether with hypoxia or hypercapnia: Secondary | ICD-10-CM | POA: Diagnosis not present

## 2015-12-04 DIAGNOSIS — I110 Hypertensive heart disease with heart failure: Secondary | ICD-10-CM | POA: Diagnosis not present

## 2015-12-04 DIAGNOSIS — E785 Hyperlipidemia, unspecified: Secondary | ICD-10-CM | POA: Diagnosis not present

## 2015-12-04 DIAGNOSIS — E669 Obesity, unspecified: Secondary | ICD-10-CM | POA: Diagnosis not present

## 2015-12-04 DIAGNOSIS — L03116 Cellulitis of left lower limb: Secondary | ICD-10-CM | POA: Diagnosis not present

## 2015-12-04 DIAGNOSIS — E039 Hypothyroidism, unspecified: Secondary | ICD-10-CM | POA: Diagnosis not present

## 2015-12-04 DIAGNOSIS — Z8673 Personal history of transient ischemic attack (TIA), and cerebral infarction without residual deficits: Secondary | ICD-10-CM | POA: Diagnosis not present

## 2015-12-05 DIAGNOSIS — L03116 Cellulitis of left lower limb: Secondary | ICD-10-CM | POA: Diagnosis not present

## 2015-12-05 DIAGNOSIS — Z880 Allergy status to penicillin: Secondary | ICD-10-CM | POA: Diagnosis not present

## 2015-12-05 DIAGNOSIS — Z881 Allergy status to other antibiotic agents status: Secondary | ICD-10-CM | POA: Diagnosis not present

## 2015-12-05 DIAGNOSIS — R21 Rash and other nonspecific skin eruption: Secondary | ICD-10-CM | POA: Diagnosis not present

## 2015-12-05 DIAGNOSIS — Z9981 Dependence on supplemental oxygen: Secondary | ICD-10-CM | POA: Diagnosis not present

## 2015-12-05 DIAGNOSIS — E119 Type 2 diabetes mellitus without complications: Secondary | ICD-10-CM | POA: Diagnosis not present

## 2015-12-05 DIAGNOSIS — Z885 Allergy status to narcotic agent status: Secondary | ICD-10-CM | POA: Diagnosis not present

## 2015-12-05 DIAGNOSIS — I509 Heart failure, unspecified: Secondary | ICD-10-CM | POA: Diagnosis not present

## 2015-12-05 DIAGNOSIS — J449 Chronic obstructive pulmonary disease, unspecified: Secondary | ICD-10-CM | POA: Diagnosis not present

## 2015-12-05 DIAGNOSIS — I10 Essential (primary) hypertension: Secondary | ICD-10-CM | POA: Diagnosis not present

## 2015-12-09 DIAGNOSIS — T1490 Injury, unspecified: Secondary | ICD-10-CM | POA: Diagnosis not present

## 2015-12-09 DIAGNOSIS — L03116 Cellulitis of left lower limb: Secondary | ICD-10-CM | POA: Diagnosis not present

## 2015-12-09 DIAGNOSIS — I5030 Unspecified diastolic (congestive) heart failure: Secondary | ICD-10-CM | POA: Diagnosis not present

## 2015-12-09 DIAGNOSIS — J9610 Chronic respiratory failure, unspecified whether with hypoxia or hypercapnia: Secondary | ICD-10-CM | POA: Diagnosis not present

## 2015-12-09 DIAGNOSIS — J441 Chronic obstructive pulmonary disease with (acute) exacerbation: Secondary | ICD-10-CM | POA: Diagnosis not present

## 2015-12-09 DIAGNOSIS — T148 Other injury of unspecified body region: Secondary | ICD-10-CM | POA: Diagnosis not present

## 2015-12-09 DIAGNOSIS — E119 Type 2 diabetes mellitus without complications: Secondary | ICD-10-CM | POA: Diagnosis not present

## 2015-12-09 DIAGNOSIS — I110 Hypertensive heart disease with heart failure: Secondary | ICD-10-CM | POA: Diagnosis not present

## 2015-12-12 DIAGNOSIS — I5030 Unspecified diastolic (congestive) heart failure: Secondary | ICD-10-CM | POA: Diagnosis not present

## 2015-12-12 DIAGNOSIS — L03116 Cellulitis of left lower limb: Secondary | ICD-10-CM | POA: Diagnosis not present

## 2015-12-12 DIAGNOSIS — E119 Type 2 diabetes mellitus without complications: Secondary | ICD-10-CM | POA: Diagnosis not present

## 2015-12-12 DIAGNOSIS — I110 Hypertensive heart disease with heart failure: Secondary | ICD-10-CM | POA: Diagnosis not present

## 2015-12-12 DIAGNOSIS — J9610 Chronic respiratory failure, unspecified whether with hypoxia or hypercapnia: Secondary | ICD-10-CM | POA: Diagnosis not present

## 2015-12-12 DIAGNOSIS — J441 Chronic obstructive pulmonary disease with (acute) exacerbation: Secondary | ICD-10-CM | POA: Diagnosis not present

## 2015-12-14 DIAGNOSIS — R402411 Glasgow coma scale score 13-15, in the field [EMT or ambulance]: Secondary | ICD-10-CM | POA: Diagnosis not present

## 2015-12-14 DIAGNOSIS — R531 Weakness: Secondary | ICD-10-CM | POA: Diagnosis not present

## 2015-12-16 DIAGNOSIS — M1990 Unspecified osteoarthritis, unspecified site: Secondary | ICD-10-CM | POA: Diagnosis present

## 2015-12-16 DIAGNOSIS — Z7401 Bed confinement status: Secondary | ICD-10-CM | POA: Diagnosis not present

## 2015-12-16 DIAGNOSIS — Z780 Asymptomatic menopausal state: Secondary | ICD-10-CM | POA: Diagnosis not present

## 2015-12-16 DIAGNOSIS — E785 Hyperlipidemia, unspecified: Secondary | ICD-10-CM | POA: Diagnosis not present

## 2015-12-16 DIAGNOSIS — M479 Spondylosis, unspecified: Secondary | ICD-10-CM | POA: Diagnosis not present

## 2015-12-16 DIAGNOSIS — Z7951 Long term (current) use of inhaled steroids: Secondary | ICD-10-CM | POA: Diagnosis not present

## 2015-12-16 DIAGNOSIS — R402411 Glasgow coma scale score 13-15, in the field [EMT or ambulance]: Secondary | ICD-10-CM | POA: Diagnosis not present

## 2015-12-16 DIAGNOSIS — M6281 Muscle weakness (generalized): Secondary | ICD-10-CM | POA: Diagnosis not present

## 2015-12-16 DIAGNOSIS — Z880 Allergy status to penicillin: Secondary | ICD-10-CM | POA: Diagnosis not present

## 2015-12-16 DIAGNOSIS — E860 Dehydration: Secondary | ICD-10-CM | POA: Diagnosis not present

## 2015-12-16 DIAGNOSIS — R2681 Unsteadiness on feet: Secondary | ICD-10-CM | POA: Diagnosis not present

## 2015-12-16 DIAGNOSIS — Z794 Long term (current) use of insulin: Secondary | ICD-10-CM | POA: Diagnosis not present

## 2015-12-16 DIAGNOSIS — Z9181 History of falling: Secondary | ICD-10-CM | POA: Diagnosis not present

## 2015-12-16 DIAGNOSIS — I5032 Chronic diastolic (congestive) heart failure: Secondary | ICD-10-CM | POA: Diagnosis not present

## 2015-12-16 DIAGNOSIS — I2510 Atherosclerotic heart disease of native coronary artery without angina pectoris: Secondary | ICD-10-CM | POA: Diagnosis present

## 2015-12-16 DIAGNOSIS — R627 Adult failure to thrive: Secondary | ICD-10-CM | POA: Diagnosis not present

## 2015-12-16 DIAGNOSIS — R531 Weakness: Secondary | ICD-10-CM | POA: Diagnosis present

## 2015-12-16 DIAGNOSIS — E1165 Type 2 diabetes mellitus with hyperglycemia: Secondary | ICD-10-CM | POA: Diagnosis not present

## 2015-12-16 DIAGNOSIS — R0682 Tachypnea, not elsewhere classified: Secondary | ICD-10-CM | POA: Diagnosis not present

## 2015-12-16 DIAGNOSIS — J449 Chronic obstructive pulmonary disease, unspecified: Secondary | ICD-10-CM | POA: Diagnosis present

## 2015-12-16 DIAGNOSIS — Z885 Allergy status to narcotic agent status: Secondary | ICD-10-CM | POA: Diagnosis not present

## 2015-12-16 DIAGNOSIS — F4489 Other dissociative and conversion disorders: Secondary | ICD-10-CM | POA: Diagnosis not present

## 2015-12-16 DIAGNOSIS — Z881 Allergy status to other antibiotic agents status: Secondary | ICD-10-CM | POA: Diagnosis not present

## 2015-12-16 DIAGNOSIS — M170 Bilateral primary osteoarthritis of knee: Secondary | ICD-10-CM | POA: Diagnosis present

## 2015-12-16 DIAGNOSIS — Z79899 Other long term (current) drug therapy: Secondary | ICD-10-CM | POA: Diagnosis not present

## 2015-12-16 DIAGNOSIS — R410 Disorientation, unspecified: Secondary | ICD-10-CM | POA: Diagnosis present

## 2015-12-16 DIAGNOSIS — J441 Chronic obstructive pulmonary disease with (acute) exacerbation: Secondary | ICD-10-CM | POA: Diagnosis not present

## 2015-12-16 DIAGNOSIS — K219 Gastro-esophageal reflux disease without esophagitis: Secondary | ICD-10-CM | POA: Diagnosis not present

## 2015-12-16 DIAGNOSIS — I110 Hypertensive heart disease with heart failure: Secondary | ICD-10-CM | POA: Diagnosis present

## 2015-12-16 DIAGNOSIS — E1310 Other specified diabetes mellitus with ketoacidosis without coma: Secondary | ICD-10-CM | POA: Diagnosis not present

## 2015-12-16 DIAGNOSIS — E039 Hypothyroidism, unspecified: Secondary | ICD-10-CM | POA: Diagnosis present

## 2015-12-19 DIAGNOSIS — E039 Hypothyroidism, unspecified: Secondary | ICD-10-CM | POA: Diagnosis not present

## 2015-12-19 DIAGNOSIS — R531 Weakness: Secondary | ICD-10-CM | POA: Diagnosis not present

## 2015-12-19 DIAGNOSIS — M25579 Pain in unspecified ankle and joints of unspecified foot: Secondary | ICD-10-CM | POA: Diagnosis not present

## 2015-12-19 DIAGNOSIS — K219 Gastro-esophageal reflux disease without esophagitis: Secondary | ICD-10-CM | POA: Diagnosis not present

## 2015-12-19 DIAGNOSIS — E1310 Other specified diabetes mellitus with ketoacidosis without coma: Secondary | ICD-10-CM | POA: Diagnosis not present

## 2015-12-19 DIAGNOSIS — E860 Dehydration: Secondary | ICD-10-CM | POA: Diagnosis not present

## 2015-12-19 DIAGNOSIS — Z780 Asymptomatic menopausal state: Secondary | ICD-10-CM | POA: Diagnosis not present

## 2015-12-19 DIAGNOSIS — M170 Bilateral primary osteoarthritis of knee: Secondary | ICD-10-CM | POA: Diagnosis not present

## 2015-12-19 DIAGNOSIS — E785 Hyperlipidemia, unspecified: Secondary | ICD-10-CM | POA: Diagnosis not present

## 2015-12-19 DIAGNOSIS — Z885 Allergy status to narcotic agent status: Secondary | ICD-10-CM | POA: Diagnosis not present

## 2015-12-19 DIAGNOSIS — E119 Type 2 diabetes mellitus without complications: Secondary | ICD-10-CM | POA: Diagnosis not present

## 2015-12-19 DIAGNOSIS — J441 Chronic obstructive pulmonary disease with (acute) exacerbation: Secondary | ICD-10-CM | POA: Diagnosis not present

## 2015-12-19 DIAGNOSIS — J9610 Chronic respiratory failure, unspecified whether with hypoxia or hypercapnia: Secondary | ICD-10-CM | POA: Diagnosis not present

## 2015-12-19 DIAGNOSIS — I2510 Atherosclerotic heart disease of native coronary artery without angina pectoris: Secondary | ICD-10-CM | POA: Diagnosis not present

## 2015-12-19 DIAGNOSIS — M479 Spondylosis, unspecified: Secondary | ICD-10-CM | POA: Diagnosis not present

## 2015-12-19 DIAGNOSIS — M1990 Unspecified osteoarthritis, unspecified site: Secondary | ICD-10-CM | POA: Diagnosis not present

## 2015-12-19 DIAGNOSIS — L03116 Cellulitis of left lower limb: Secondary | ICD-10-CM | POA: Diagnosis not present

## 2015-12-19 DIAGNOSIS — I5030 Unspecified diastolic (congestive) heart failure: Secondary | ICD-10-CM | POA: Diagnosis not present

## 2015-12-19 DIAGNOSIS — M6281 Muscle weakness (generalized): Secondary | ICD-10-CM | POA: Diagnosis not present

## 2015-12-19 DIAGNOSIS — I5032 Chronic diastolic (congestive) heart failure: Secondary | ICD-10-CM | POA: Diagnosis not present

## 2015-12-19 DIAGNOSIS — Z880 Allergy status to penicillin: Secondary | ICD-10-CM | POA: Diagnosis not present

## 2015-12-19 DIAGNOSIS — Z7401 Bed confinement status: Secondary | ICD-10-CM | POA: Diagnosis not present

## 2015-12-19 DIAGNOSIS — I110 Hypertensive heart disease with heart failure: Secondary | ICD-10-CM | POA: Diagnosis not present

## 2015-12-19 DIAGNOSIS — Z881 Allergy status to other antibiotic agents status: Secondary | ICD-10-CM | POA: Diagnosis not present

## 2016-02-01 DIAGNOSIS — I110 Hypertensive heart disease with heart failure: Secondary | ICD-10-CM | POA: Diagnosis not present

## 2016-02-01 DIAGNOSIS — L03116 Cellulitis of left lower limb: Secondary | ICD-10-CM | POA: Diagnosis not present

## 2016-02-01 DIAGNOSIS — J9610 Chronic respiratory failure, unspecified whether with hypoxia or hypercapnia: Secondary | ICD-10-CM | POA: Diagnosis not present

## 2016-02-01 DIAGNOSIS — I5030 Unspecified diastolic (congestive) heart failure: Secondary | ICD-10-CM | POA: Diagnosis not present

## 2016-02-01 DIAGNOSIS — E119 Type 2 diabetes mellitus without complications: Secondary | ICD-10-CM | POA: Diagnosis not present

## 2016-02-01 DIAGNOSIS — J441 Chronic obstructive pulmonary disease with (acute) exacerbation: Secondary | ICD-10-CM | POA: Diagnosis not present

## 2016-02-02 DIAGNOSIS — E039 Hypothyroidism, unspecified: Secondary | ICD-10-CM | POA: Diagnosis not present

## 2016-02-02 DIAGNOSIS — J9611 Chronic respiratory failure with hypoxia: Secondary | ICD-10-CM | POA: Diagnosis not present

## 2016-02-02 DIAGNOSIS — J9612 Chronic respiratory failure with hypercapnia: Secondary | ICD-10-CM | POA: Diagnosis not present

## 2016-02-02 DIAGNOSIS — I110 Hypertensive heart disease with heart failure: Secondary | ICD-10-CM | POA: Diagnosis not present

## 2016-02-02 DIAGNOSIS — Z8673 Personal history of transient ischemic attack (TIA), and cerebral infarction without residual deficits: Secondary | ICD-10-CM | POA: Diagnosis not present

## 2016-02-02 DIAGNOSIS — E669 Obesity, unspecified: Secondary | ICD-10-CM | POA: Diagnosis not present

## 2016-02-02 DIAGNOSIS — L03116 Cellulitis of left lower limb: Secondary | ICD-10-CM | POA: Diagnosis not present

## 2016-02-02 DIAGNOSIS — J441 Chronic obstructive pulmonary disease with (acute) exacerbation: Secondary | ICD-10-CM | POA: Diagnosis not present

## 2016-02-02 DIAGNOSIS — Z6841 Body Mass Index (BMI) 40.0 and over, adult: Secondary | ICD-10-CM | POA: Diagnosis not present

## 2016-02-02 DIAGNOSIS — E119 Type 2 diabetes mellitus without complications: Secondary | ICD-10-CM | POA: Diagnosis not present

## 2016-02-02 DIAGNOSIS — I5030 Unspecified diastolic (congestive) heart failure: Secondary | ICD-10-CM | POA: Diagnosis not present

## 2016-02-02 DIAGNOSIS — J9610 Chronic respiratory failure, unspecified whether with hypoxia or hypercapnia: Secondary | ICD-10-CM | POA: Diagnosis not present

## 2016-02-02 DIAGNOSIS — M1990 Unspecified osteoarthritis, unspecified site: Secondary | ICD-10-CM | POA: Diagnosis not present

## 2016-02-02 DIAGNOSIS — I2510 Atherosclerotic heart disease of native coronary artery without angina pectoris: Secondary | ICD-10-CM | POA: Diagnosis not present

## 2016-02-02 DIAGNOSIS — E785 Hyperlipidemia, unspecified: Secondary | ICD-10-CM | POA: Diagnosis not present

## 2016-02-02 DIAGNOSIS — Z9981 Dependence on supplemental oxygen: Secondary | ICD-10-CM | POA: Diagnosis not present

## 2016-02-02 DIAGNOSIS — Z794 Long term (current) use of insulin: Secondary | ICD-10-CM | POA: Diagnosis not present

## 2016-02-04 DIAGNOSIS — I110 Hypertensive heart disease with heart failure: Secondary | ICD-10-CM | POA: Diagnosis not present

## 2016-02-04 DIAGNOSIS — I5030 Unspecified diastolic (congestive) heart failure: Secondary | ICD-10-CM | POA: Diagnosis not present

## 2016-02-04 DIAGNOSIS — J9610 Chronic respiratory failure, unspecified whether with hypoxia or hypercapnia: Secondary | ICD-10-CM | POA: Diagnosis not present

## 2016-02-04 DIAGNOSIS — L03116 Cellulitis of left lower limb: Secondary | ICD-10-CM | POA: Diagnosis not present

## 2016-02-04 DIAGNOSIS — J441 Chronic obstructive pulmonary disease with (acute) exacerbation: Secondary | ICD-10-CM | POA: Diagnosis not present

## 2016-02-04 DIAGNOSIS — E119 Type 2 diabetes mellitus without complications: Secondary | ICD-10-CM | POA: Diagnosis not present

## 2016-02-07 DIAGNOSIS — E119 Type 2 diabetes mellitus without complications: Secondary | ICD-10-CM | POA: Diagnosis not present

## 2016-02-07 DIAGNOSIS — I5030 Unspecified diastolic (congestive) heart failure: Secondary | ICD-10-CM | POA: Diagnosis not present

## 2016-02-07 DIAGNOSIS — J9610 Chronic respiratory failure, unspecified whether with hypoxia or hypercapnia: Secondary | ICD-10-CM | POA: Diagnosis not present

## 2016-02-07 DIAGNOSIS — L03116 Cellulitis of left lower limb: Secondary | ICD-10-CM | POA: Diagnosis not present

## 2016-02-07 DIAGNOSIS — J441 Chronic obstructive pulmonary disease with (acute) exacerbation: Secondary | ICD-10-CM | POA: Diagnosis not present

## 2016-02-07 DIAGNOSIS — I110 Hypertensive heart disease with heart failure: Secondary | ICD-10-CM | POA: Diagnosis not present

## 2016-02-13 DIAGNOSIS — I110 Hypertensive heart disease with heart failure: Secondary | ICD-10-CM | POA: Diagnosis not present

## 2016-02-13 DIAGNOSIS — J441 Chronic obstructive pulmonary disease with (acute) exacerbation: Secondary | ICD-10-CM | POA: Diagnosis not present

## 2016-02-13 DIAGNOSIS — I5030 Unspecified diastolic (congestive) heart failure: Secondary | ICD-10-CM | POA: Diagnosis not present

## 2016-02-13 DIAGNOSIS — J9610 Chronic respiratory failure, unspecified whether with hypoxia or hypercapnia: Secondary | ICD-10-CM | POA: Diagnosis not present

## 2016-02-13 DIAGNOSIS — L03116 Cellulitis of left lower limb: Secondary | ICD-10-CM | POA: Diagnosis not present

## 2016-02-13 DIAGNOSIS — E119 Type 2 diabetes mellitus without complications: Secondary | ICD-10-CM | POA: Diagnosis not present

## 2016-02-19 DIAGNOSIS — I5030 Unspecified diastolic (congestive) heart failure: Secondary | ICD-10-CM | POA: Diagnosis not present

## 2016-02-19 DIAGNOSIS — J441 Chronic obstructive pulmonary disease with (acute) exacerbation: Secondary | ICD-10-CM | POA: Diagnosis not present

## 2016-02-19 DIAGNOSIS — L03116 Cellulitis of left lower limb: Secondary | ICD-10-CM | POA: Diagnosis not present

## 2016-02-19 DIAGNOSIS — E119 Type 2 diabetes mellitus without complications: Secondary | ICD-10-CM | POA: Diagnosis not present

## 2016-02-19 DIAGNOSIS — I110 Hypertensive heart disease with heart failure: Secondary | ICD-10-CM | POA: Diagnosis not present

## 2016-02-19 DIAGNOSIS — J9610 Chronic respiratory failure, unspecified whether with hypoxia or hypercapnia: Secondary | ICD-10-CM | POA: Diagnosis not present

## 2016-02-25 DIAGNOSIS — J441 Chronic obstructive pulmonary disease with (acute) exacerbation: Secondary | ICD-10-CM | POA: Diagnosis not present

## 2016-02-25 DIAGNOSIS — E119 Type 2 diabetes mellitus without complications: Secondary | ICD-10-CM | POA: Diagnosis not present

## 2016-02-25 DIAGNOSIS — J9610 Chronic respiratory failure, unspecified whether with hypoxia or hypercapnia: Secondary | ICD-10-CM | POA: Diagnosis not present

## 2016-02-25 DIAGNOSIS — I5030 Unspecified diastolic (congestive) heart failure: Secondary | ICD-10-CM | POA: Diagnosis not present

## 2016-02-25 DIAGNOSIS — L03116 Cellulitis of left lower limb: Secondary | ICD-10-CM | POA: Diagnosis not present

## 2016-02-25 DIAGNOSIS — I110 Hypertensive heart disease with heart failure: Secondary | ICD-10-CM | POA: Diagnosis not present

## 2016-03-01 DIAGNOSIS — R531 Weakness: Secondary | ICD-10-CM | POA: Diagnosis not present

## 2016-03-01 DIAGNOSIS — R404 Transient alteration of awareness: Secondary | ICD-10-CM | POA: Diagnosis not present

## 2016-03-04 DIAGNOSIS — I5030 Unspecified diastolic (congestive) heart failure: Secondary | ICD-10-CM | POA: Diagnosis not present

## 2016-03-04 DIAGNOSIS — J9610 Chronic respiratory failure, unspecified whether with hypoxia or hypercapnia: Secondary | ICD-10-CM | POA: Diagnosis not present

## 2016-03-04 DIAGNOSIS — L03116 Cellulitis of left lower limb: Secondary | ICD-10-CM | POA: Diagnosis not present

## 2016-03-04 DIAGNOSIS — E119 Type 2 diabetes mellitus without complications: Secondary | ICD-10-CM | POA: Diagnosis not present

## 2016-03-04 DIAGNOSIS — I110 Hypertensive heart disease with heart failure: Secondary | ICD-10-CM | POA: Diagnosis not present

## 2016-03-04 DIAGNOSIS — J441 Chronic obstructive pulmonary disease with (acute) exacerbation: Secondary | ICD-10-CM | POA: Diagnosis not present

## 2016-03-09 DIAGNOSIS — L03116 Cellulitis of left lower limb: Secondary | ICD-10-CM | POA: Diagnosis not present

## 2016-03-09 DIAGNOSIS — J9610 Chronic respiratory failure, unspecified whether with hypoxia or hypercapnia: Secondary | ICD-10-CM | POA: Diagnosis not present

## 2016-03-09 DIAGNOSIS — I110 Hypertensive heart disease with heart failure: Secondary | ICD-10-CM | POA: Diagnosis not present

## 2016-03-09 DIAGNOSIS — J441 Chronic obstructive pulmonary disease with (acute) exacerbation: Secondary | ICD-10-CM | POA: Diagnosis not present

## 2016-03-09 DIAGNOSIS — I5030 Unspecified diastolic (congestive) heart failure: Secondary | ICD-10-CM | POA: Diagnosis not present

## 2016-03-09 DIAGNOSIS — E119 Type 2 diabetes mellitus without complications: Secondary | ICD-10-CM | POA: Diagnosis not present

## 2016-03-16 DIAGNOSIS — E1165 Type 2 diabetes mellitus with hyperglycemia: Secondary | ICD-10-CM | POA: Diagnosis not present

## 2016-03-16 DIAGNOSIS — Z79899 Other long term (current) drug therapy: Secondary | ICD-10-CM | POA: Diagnosis not present

## 2016-03-16 DIAGNOSIS — I10 Essential (primary) hypertension: Secondary | ICD-10-CM | POA: Diagnosis not present

## 2016-03-16 DIAGNOSIS — K648 Other hemorrhoids: Secondary | ICD-10-CM | POA: Diagnosis not present

## 2016-03-17 DIAGNOSIS — L03116 Cellulitis of left lower limb: Secondary | ICD-10-CM | POA: Diagnosis not present

## 2016-03-17 DIAGNOSIS — J9610 Chronic respiratory failure, unspecified whether with hypoxia or hypercapnia: Secondary | ICD-10-CM | POA: Diagnosis not present

## 2016-03-17 DIAGNOSIS — J441 Chronic obstructive pulmonary disease with (acute) exacerbation: Secondary | ICD-10-CM | POA: Diagnosis not present

## 2016-03-17 DIAGNOSIS — E119 Type 2 diabetes mellitus without complications: Secondary | ICD-10-CM | POA: Diagnosis not present

## 2016-03-17 DIAGNOSIS — I110 Hypertensive heart disease with heart failure: Secondary | ICD-10-CM | POA: Diagnosis not present

## 2016-03-17 DIAGNOSIS — I5030 Unspecified diastolic (congestive) heart failure: Secondary | ICD-10-CM | POA: Diagnosis not present

## 2016-03-25 DIAGNOSIS — I110 Hypertensive heart disease with heart failure: Secondary | ICD-10-CM | POA: Diagnosis not present

## 2016-03-25 DIAGNOSIS — E119 Type 2 diabetes mellitus without complications: Secondary | ICD-10-CM | POA: Diagnosis not present

## 2016-03-25 DIAGNOSIS — I5030 Unspecified diastolic (congestive) heart failure: Secondary | ICD-10-CM | POA: Diagnosis not present

## 2016-03-25 DIAGNOSIS — J9610 Chronic respiratory failure, unspecified whether with hypoxia or hypercapnia: Secondary | ICD-10-CM | POA: Diagnosis not present

## 2016-03-25 DIAGNOSIS — J441 Chronic obstructive pulmonary disease with (acute) exacerbation: Secondary | ICD-10-CM | POA: Diagnosis not present

## 2016-03-25 DIAGNOSIS — L03116 Cellulitis of left lower limb: Secondary | ICD-10-CM | POA: Diagnosis not present

## 2016-03-31 DIAGNOSIS — I110 Hypertensive heart disease with heart failure: Secondary | ICD-10-CM | POA: Diagnosis not present

## 2016-03-31 DIAGNOSIS — J441 Chronic obstructive pulmonary disease with (acute) exacerbation: Secondary | ICD-10-CM | POA: Diagnosis not present

## 2016-03-31 DIAGNOSIS — J9610 Chronic respiratory failure, unspecified whether with hypoxia or hypercapnia: Secondary | ICD-10-CM | POA: Diagnosis not present

## 2016-03-31 DIAGNOSIS — I5030 Unspecified diastolic (congestive) heart failure: Secondary | ICD-10-CM | POA: Diagnosis not present

## 2016-03-31 DIAGNOSIS — L03116 Cellulitis of left lower limb: Secondary | ICD-10-CM | POA: Diagnosis not present

## 2016-03-31 DIAGNOSIS — E119 Type 2 diabetes mellitus without complications: Secondary | ICD-10-CM | POA: Diagnosis not present

## 2016-04-24 DIAGNOSIS — S0990XA Unspecified injury of head, initial encounter: Secondary | ICD-10-CM | POA: Diagnosis not present

## 2016-04-24 DIAGNOSIS — S0091XA Abrasion of unspecified part of head, initial encounter: Secondary | ICD-10-CM | POA: Diagnosis not present

## 2016-05-14 DIAGNOSIS — K625 Hemorrhage of anus and rectum: Secondary | ICD-10-CM | POA: Diagnosis not present

## 2016-06-07 DIAGNOSIS — Z794 Long term (current) use of insulin: Secondary | ICD-10-CM | POA: Diagnosis not present

## 2016-06-07 DIAGNOSIS — Z7951 Long term (current) use of inhaled steroids: Secondary | ICD-10-CM | POA: Diagnosis not present

## 2016-06-07 DIAGNOSIS — E039 Hypothyroidism, unspecified: Secondary | ICD-10-CM | POA: Diagnosis present

## 2016-06-07 DIAGNOSIS — R55 Syncope and collapse: Secondary | ICD-10-CM | POA: Diagnosis not present

## 2016-06-07 DIAGNOSIS — N189 Chronic kidney disease, unspecified: Secondary | ICD-10-CM | POA: Diagnosis not present

## 2016-06-07 DIAGNOSIS — B9620 Unspecified Escherichia coli [E. coli] as the cause of diseases classified elsewhere: Secondary | ICD-10-CM | POA: Diagnosis present

## 2016-06-07 DIAGNOSIS — Z780 Asymptomatic menopausal state: Secondary | ICD-10-CM | POA: Diagnosis not present

## 2016-06-07 DIAGNOSIS — Z881 Allergy status to other antibiotic agents status: Secondary | ICD-10-CM | POA: Diagnosis not present

## 2016-06-07 DIAGNOSIS — R0789 Other chest pain: Secondary | ICD-10-CM | POA: Diagnosis not present

## 2016-06-07 DIAGNOSIS — N390 Urinary tract infection, site not specified: Secondary | ICD-10-CM | POA: Diagnosis not present

## 2016-06-07 DIAGNOSIS — N178 Other acute kidney failure: Secondary | ICD-10-CM | POA: Diagnosis not present

## 2016-06-07 DIAGNOSIS — N289 Disorder of kidney and ureter, unspecified: Secondary | ICD-10-CM | POA: Diagnosis not present

## 2016-06-07 DIAGNOSIS — I130 Hypertensive heart and chronic kidney disease with heart failure and stage 1 through stage 4 chronic kidney disease, or unspecified chronic kidney disease: Secondary | ICD-10-CM | POA: Diagnosis not present

## 2016-06-07 DIAGNOSIS — E871 Hypo-osmolality and hyponatremia: Secondary | ICD-10-CM | POA: Diagnosis not present

## 2016-06-07 DIAGNOSIS — M170 Bilateral primary osteoarthritis of knee: Secondary | ICD-10-CM | POA: Diagnosis present

## 2016-06-07 DIAGNOSIS — Z79899 Other long term (current) drug therapy: Secondary | ICD-10-CM | POA: Diagnosis not present

## 2016-06-07 DIAGNOSIS — E1122 Type 2 diabetes mellitus with diabetic chronic kidney disease: Secondary | ICD-10-CM | POA: Diagnosis present

## 2016-06-07 DIAGNOSIS — I2510 Atherosclerotic heart disease of native coronary artery without angina pectoris: Secondary | ICD-10-CM | POA: Diagnosis present

## 2016-06-07 DIAGNOSIS — N179 Acute kidney failure, unspecified: Secondary | ICD-10-CM | POA: Diagnosis not present

## 2016-06-07 DIAGNOSIS — Z9981 Dependence on supplemental oxygen: Secondary | ICD-10-CM | POA: Diagnosis not present

## 2016-06-07 DIAGNOSIS — K219 Gastro-esophageal reflux disease without esophagitis: Secondary | ICD-10-CM | POA: Diagnosis present

## 2016-06-07 DIAGNOSIS — J45909 Unspecified asthma, uncomplicated: Secondary | ICD-10-CM | POA: Diagnosis present

## 2016-06-07 DIAGNOSIS — I5032 Chronic diastolic (congestive) heart failure: Secondary | ICD-10-CM | POA: Diagnosis not present

## 2016-06-07 DIAGNOSIS — Z886 Allergy status to analgesic agent status: Secondary | ICD-10-CM | POA: Diagnosis not present

## 2016-06-07 DIAGNOSIS — N183 Chronic kidney disease, stage 3 (moderate): Secondary | ICD-10-CM | POA: Diagnosis not present

## 2016-06-07 DIAGNOSIS — R079 Chest pain, unspecified: Secondary | ICD-10-CM | POA: Diagnosis not present

## 2016-06-07 DIAGNOSIS — Z7401 Bed confinement status: Secondary | ICD-10-CM | POA: Diagnosis not present

## 2016-06-07 DIAGNOSIS — Z880 Allergy status to penicillin: Secondary | ICD-10-CM | POA: Diagnosis not present

## 2016-06-07 DIAGNOSIS — E1140 Type 2 diabetes mellitus with diabetic neuropathy, unspecified: Secondary | ICD-10-CM | POA: Diagnosis present

## 2016-06-07 DIAGNOSIS — E860 Dehydration: Secondary | ICD-10-CM | POA: Diagnosis not present

## 2016-06-07 DIAGNOSIS — E7800 Pure hypercholesterolemia, unspecified: Secondary | ICD-10-CM | POA: Diagnosis present

## 2016-06-07 DIAGNOSIS — E785 Hyperlipidemia, unspecified: Secondary | ICD-10-CM | POA: Diagnosis present

## 2016-06-12 DIAGNOSIS — I110 Hypertensive heart disease with heart failure: Secondary | ICD-10-CM | POA: Diagnosis not present

## 2016-06-12 DIAGNOSIS — E669 Obesity, unspecified: Secondary | ICD-10-CM | POA: Diagnosis not present

## 2016-06-12 DIAGNOSIS — E1122 Type 2 diabetes mellitus with diabetic chronic kidney disease: Secondary | ICD-10-CM | POA: Diagnosis not present

## 2016-06-12 DIAGNOSIS — Z9981 Dependence on supplemental oxygen: Secondary | ICD-10-CM | POA: Diagnosis not present

## 2016-06-12 DIAGNOSIS — F17219 Nicotine dependence, cigarettes, with unspecified nicotine-induced disorders: Secondary | ICD-10-CM | POA: Diagnosis not present

## 2016-06-12 DIAGNOSIS — B9629 Other Escherichia coli [E. coli] as the cause of diseases classified elsewhere: Secondary | ICD-10-CM | POA: Diagnosis not present

## 2016-06-12 DIAGNOSIS — M1711 Unilateral primary osteoarthritis, right knee: Secondary | ICD-10-CM | POA: Diagnosis not present

## 2016-06-12 DIAGNOSIS — I5030 Unspecified diastolic (congestive) heart failure: Secondary | ICD-10-CM | POA: Diagnosis not present

## 2016-06-12 DIAGNOSIS — E039 Hypothyroidism, unspecified: Secondary | ICD-10-CM | POA: Diagnosis not present

## 2016-06-12 DIAGNOSIS — J441 Chronic obstructive pulmonary disease with (acute) exacerbation: Secondary | ICD-10-CM | POA: Diagnosis not present

## 2016-06-12 DIAGNOSIS — N390 Urinary tract infection, site not specified: Secondary | ICD-10-CM | POA: Diagnosis not present

## 2016-06-12 DIAGNOSIS — Z5181 Encounter for therapeutic drug level monitoring: Secondary | ICD-10-CM | POA: Diagnosis not present

## 2016-06-12 DIAGNOSIS — I2510 Atherosclerotic heart disease of native coronary artery without angina pectoris: Secondary | ICD-10-CM | POA: Diagnosis not present

## 2016-06-12 DIAGNOSIS — Z794 Long term (current) use of insulin: Secondary | ICD-10-CM | POA: Diagnosis not present

## 2016-06-12 DIAGNOSIS — N178 Other acute kidney failure: Secondary | ICD-10-CM | POA: Diagnosis not present

## 2016-06-12 DIAGNOSIS — K219 Gastro-esophageal reflux disease without esophagitis: Secondary | ICD-10-CM | POA: Diagnosis not present

## 2016-06-12 DIAGNOSIS — Z9119 Patient's noncompliance with other medical treatment and regimen: Secondary | ICD-10-CM | POA: Diagnosis not present

## 2016-06-12 DIAGNOSIS — M1712 Unilateral primary osteoarthritis, left knee: Secondary | ICD-10-CM | POA: Diagnosis not present

## 2016-06-12 DIAGNOSIS — B9620 Unspecified Escherichia coli [E. coli] as the cause of diseases classified elsewhere: Secondary | ICD-10-CM | POA: Diagnosis not present

## 2016-06-12 DIAGNOSIS — N189 Chronic kidney disease, unspecified: Secondary | ICD-10-CM | POA: Diagnosis not present

## 2016-06-17 DIAGNOSIS — I5030 Unspecified diastolic (congestive) heart failure: Secondary | ICD-10-CM | POA: Diagnosis not present

## 2016-06-17 DIAGNOSIS — E1122 Type 2 diabetes mellitus with diabetic chronic kidney disease: Secondary | ICD-10-CM | POA: Diagnosis not present

## 2016-06-17 DIAGNOSIS — I110 Hypertensive heart disease with heart failure: Secondary | ICD-10-CM | POA: Diagnosis not present

## 2016-06-17 DIAGNOSIS — B9620 Unspecified Escherichia coli [E. coli] as the cause of diseases classified elsewhere: Secondary | ICD-10-CM | POA: Diagnosis not present

## 2016-06-17 DIAGNOSIS — N189 Chronic kidney disease, unspecified: Secondary | ICD-10-CM | POA: Diagnosis not present

## 2016-06-17 DIAGNOSIS — N390 Urinary tract infection, site not specified: Secondary | ICD-10-CM | POA: Diagnosis not present

## 2016-06-27 DIAGNOSIS — I5030 Unspecified diastolic (congestive) heart failure: Secondary | ICD-10-CM | POA: Diagnosis not present

## 2016-06-27 DIAGNOSIS — E1122 Type 2 diabetes mellitus with diabetic chronic kidney disease: Secondary | ICD-10-CM | POA: Diagnosis not present

## 2016-06-27 DIAGNOSIS — B9620 Unspecified Escherichia coli [E. coli] as the cause of diseases classified elsewhere: Secondary | ICD-10-CM | POA: Diagnosis not present

## 2016-06-27 DIAGNOSIS — N390 Urinary tract infection, site not specified: Secondary | ICD-10-CM | POA: Diagnosis not present

## 2016-06-27 DIAGNOSIS — N189 Chronic kidney disease, unspecified: Secondary | ICD-10-CM | POA: Diagnosis not present

## 2016-06-27 DIAGNOSIS — I110 Hypertensive heart disease with heart failure: Secondary | ICD-10-CM | POA: Diagnosis not present

## 2016-07-02 DIAGNOSIS — I110 Hypertensive heart disease with heart failure: Secondary | ICD-10-CM | POA: Diagnosis not present

## 2016-07-02 DIAGNOSIS — I5030 Unspecified diastolic (congestive) heart failure: Secondary | ICD-10-CM | POA: Diagnosis not present

## 2016-07-02 DIAGNOSIS — N390 Urinary tract infection, site not specified: Secondary | ICD-10-CM | POA: Diagnosis not present

## 2016-07-02 DIAGNOSIS — N189 Chronic kidney disease, unspecified: Secondary | ICD-10-CM | POA: Diagnosis not present

## 2016-07-02 DIAGNOSIS — B9620 Unspecified Escherichia coli [E. coli] as the cause of diseases classified elsewhere: Secondary | ICD-10-CM | POA: Diagnosis not present

## 2016-07-02 DIAGNOSIS — E1122 Type 2 diabetes mellitus with diabetic chronic kidney disease: Secondary | ICD-10-CM | POA: Diagnosis not present

## 2016-07-07 DIAGNOSIS — E860 Dehydration: Secondary | ICD-10-CM | POA: Diagnosis not present

## 2016-07-07 DIAGNOSIS — I5032 Chronic diastolic (congestive) heart failure: Secondary | ICD-10-CM | POA: Diagnosis not present

## 2016-07-07 DIAGNOSIS — R5383 Other fatigue: Secondary | ICD-10-CM | POA: Diagnosis not present

## 2016-07-07 DIAGNOSIS — E1165 Type 2 diabetes mellitus with hyperglycemia: Secondary | ICD-10-CM | POA: Diagnosis not present

## 2016-07-07 DIAGNOSIS — N183 Chronic kidney disease, stage 3 (moderate): Secondary | ICD-10-CM | POA: Diagnosis not present

## 2016-07-07 DIAGNOSIS — I25118 Atherosclerotic heart disease of native coronary artery with other forms of angina pectoris: Secondary | ICD-10-CM | POA: Diagnosis not present

## 2016-07-07 DIAGNOSIS — J45991 Cough variant asthma: Secondary | ICD-10-CM | POA: Diagnosis not present

## 2016-07-07 DIAGNOSIS — I10 Essential (primary) hypertension: Secondary | ICD-10-CM | POA: Diagnosis not present

## 2016-07-07 DIAGNOSIS — F0280 Dementia in other diseases classified elsewhere without behavioral disturbance: Secondary | ICD-10-CM | POA: Diagnosis not present

## 2016-07-10 DIAGNOSIS — N390 Urinary tract infection, site not specified: Secondary | ICD-10-CM | POA: Diagnosis not present

## 2016-07-10 DIAGNOSIS — I5030 Unspecified diastolic (congestive) heart failure: Secondary | ICD-10-CM | POA: Diagnosis not present

## 2016-07-10 DIAGNOSIS — E1122 Type 2 diabetes mellitus with diabetic chronic kidney disease: Secondary | ICD-10-CM | POA: Diagnosis not present

## 2016-07-10 DIAGNOSIS — N189 Chronic kidney disease, unspecified: Secondary | ICD-10-CM | POA: Diagnosis not present

## 2016-07-10 DIAGNOSIS — I110 Hypertensive heart disease with heart failure: Secondary | ICD-10-CM | POA: Diagnosis not present

## 2016-07-10 DIAGNOSIS — B9620 Unspecified Escherichia coli [E. coli] as the cause of diseases classified elsewhere: Secondary | ICD-10-CM | POA: Diagnosis not present

## 2016-07-21 DIAGNOSIS — R079 Chest pain, unspecified: Secondary | ICD-10-CM | POA: Diagnosis not present

## 2016-07-21 DIAGNOSIS — W1839XA Other fall on same level, initial encounter: Secondary | ICD-10-CM | POA: Diagnosis not present

## 2016-07-21 DIAGNOSIS — R404 Transient alteration of awareness: Secondary | ICD-10-CM | POA: Diagnosis not present

## 2016-07-21 DIAGNOSIS — I5032 Chronic diastolic (congestive) heart failure: Secondary | ICD-10-CM | POA: Diagnosis not present

## 2016-07-21 DIAGNOSIS — M79632 Pain in left forearm: Secondary | ICD-10-CM | POA: Diagnosis not present

## 2016-07-21 DIAGNOSIS — E1165 Type 2 diabetes mellitus with hyperglycemia: Secondary | ICD-10-CM | POA: Diagnosis not present

## 2016-07-21 DIAGNOSIS — R1013 Epigastric pain: Secondary | ICD-10-CM | POA: Diagnosis not present

## 2016-07-21 DIAGNOSIS — E1122 Type 2 diabetes mellitus with diabetic chronic kidney disease: Secondary | ICD-10-CM | POA: Diagnosis not present

## 2016-07-21 DIAGNOSIS — N178 Other acute kidney failure: Secondary | ICD-10-CM | POA: Diagnosis not present

## 2016-07-21 DIAGNOSIS — R531 Weakness: Secondary | ICD-10-CM | POA: Diagnosis not present

## 2016-07-21 DIAGNOSIS — I130 Hypertensive heart and chronic kidney disease with heart failure and stage 1 through stage 4 chronic kidney disease, or unspecified chronic kidney disease: Secondary | ICD-10-CM | POA: Diagnosis not present

## 2016-07-21 DIAGNOSIS — E1169 Type 2 diabetes mellitus with other specified complication: Secondary | ICD-10-CM | POA: Diagnosis not present

## 2016-07-21 DIAGNOSIS — N183 Chronic kidney disease, stage 3 (moderate): Secondary | ICD-10-CM | POA: Diagnosis not present

## 2016-07-21 DIAGNOSIS — N179 Acute kidney failure, unspecified: Secondary | ICD-10-CM | POA: Diagnosis not present

## 2016-07-21 DIAGNOSIS — S0990XA Unspecified injury of head, initial encounter: Secondary | ICD-10-CM | POA: Diagnosis not present

## 2016-07-22 DIAGNOSIS — E039 Hypothyroidism, unspecified: Secondary | ICD-10-CM | POA: Diagnosis present

## 2016-07-22 DIAGNOSIS — Z881 Allergy status to other antibiotic agents status: Secondary | ICD-10-CM | POA: Diagnosis not present

## 2016-07-22 DIAGNOSIS — D631 Anemia in chronic kidney disease: Secondary | ICD-10-CM | POA: Diagnosis present

## 2016-07-22 DIAGNOSIS — Z794 Long term (current) use of insulin: Secondary | ICD-10-CM | POA: Diagnosis not present

## 2016-07-22 DIAGNOSIS — I5032 Chronic diastolic (congestive) heart failure: Secondary | ICD-10-CM | POA: Diagnosis not present

## 2016-07-22 DIAGNOSIS — N179 Acute kidney failure, unspecified: Secondary | ICD-10-CM | POA: Diagnosis present

## 2016-07-22 DIAGNOSIS — E785 Hyperlipidemia, unspecified: Secondary | ICD-10-CM | POA: Diagnosis present

## 2016-07-22 DIAGNOSIS — K219 Gastro-esophageal reflux disease without esophagitis: Secondary | ICD-10-CM | POA: Diagnosis present

## 2016-07-22 DIAGNOSIS — I130 Hypertensive heart and chronic kidney disease with heart failure and stage 1 through stage 4 chronic kidney disease, or unspecified chronic kidney disease: Secondary | ICD-10-CM | POA: Diagnosis present

## 2016-07-22 DIAGNOSIS — Z780 Asymptomatic menopausal state: Secondary | ICD-10-CM | POA: Diagnosis not present

## 2016-07-22 DIAGNOSIS — Z79899 Other long term (current) drug therapy: Secondary | ICD-10-CM | POA: Diagnosis not present

## 2016-07-22 DIAGNOSIS — Z880 Allergy status to penicillin: Secondary | ICD-10-CM | POA: Diagnosis not present

## 2016-07-22 DIAGNOSIS — I2510 Atherosclerotic heart disease of native coronary artery without angina pectoris: Secondary | ICD-10-CM | POA: Diagnosis present

## 2016-07-22 DIAGNOSIS — N183 Chronic kidney disease, stage 3 (moderate): Secondary | ICD-10-CM | POA: Diagnosis present

## 2016-07-22 DIAGNOSIS — E1122 Type 2 diabetes mellitus with diabetic chronic kidney disease: Secondary | ICD-10-CM | POA: Diagnosis not present

## 2016-07-22 DIAGNOSIS — N178 Other acute kidney failure: Secondary | ICD-10-CM | POA: Diagnosis not present

## 2016-07-22 DIAGNOSIS — M170 Bilateral primary osteoarthritis of knee: Secondary | ICD-10-CM | POA: Diagnosis present

## 2016-07-22 DIAGNOSIS — E1169 Type 2 diabetes mellitus with other specified complication: Secondary | ICD-10-CM | POA: Diagnosis not present

## 2016-07-22 DIAGNOSIS — J45909 Unspecified asthma, uncomplicated: Secondary | ICD-10-CM | POA: Diagnosis present

## 2016-07-22 DIAGNOSIS — Z885 Allergy status to narcotic agent status: Secondary | ICD-10-CM | POA: Diagnosis not present

## 2016-07-22 DIAGNOSIS — E1165 Type 2 diabetes mellitus with hyperglycemia: Secondary | ICD-10-CM | POA: Diagnosis not present

## 2016-07-27 DIAGNOSIS — I2510 Atherosclerotic heart disease of native coronary artery without angina pectoris: Secondary | ICD-10-CM | POA: Diagnosis not present

## 2016-07-27 DIAGNOSIS — Z794 Long term (current) use of insulin: Secondary | ICD-10-CM | POA: Diagnosis not present

## 2016-07-27 DIAGNOSIS — D649 Anemia, unspecified: Secondary | ICD-10-CM | POA: Diagnosis not present

## 2016-07-27 DIAGNOSIS — B9689 Other specified bacterial agents as the cause of diseases classified elsewhere: Secondary | ICD-10-CM | POA: Diagnosis not present

## 2016-07-27 DIAGNOSIS — L89310 Pressure ulcer of right buttock, unstageable: Secondary | ICD-10-CM | POA: Diagnosis not present

## 2016-07-27 DIAGNOSIS — E1165 Type 2 diabetes mellitus with hyperglycemia: Secondary | ICD-10-CM | POA: Diagnosis not present

## 2016-07-27 DIAGNOSIS — K219 Gastro-esophageal reflux disease without esophagitis: Secondary | ICD-10-CM | POA: Diagnosis not present

## 2016-07-27 DIAGNOSIS — I119 Hypertensive heart disease without heart failure: Secondary | ICD-10-CM | POA: Diagnosis not present

## 2016-07-27 DIAGNOSIS — J449 Chronic obstructive pulmonary disease, unspecified: Secondary | ICD-10-CM | POA: Diagnosis not present

## 2016-07-27 DIAGNOSIS — N390 Urinary tract infection, site not specified: Secondary | ICD-10-CM | POA: Diagnosis not present

## 2016-07-27 DIAGNOSIS — I5032 Chronic diastolic (congestive) heart failure: Secondary | ICD-10-CM | POA: Diagnosis not present

## 2016-07-27 DIAGNOSIS — Z9981 Dependence on supplemental oxygen: Secondary | ICD-10-CM | POA: Diagnosis not present

## 2016-07-27 DIAGNOSIS — M170 Bilateral primary osteoarthritis of knee: Secondary | ICD-10-CM | POA: Diagnosis not present

## 2016-07-27 DIAGNOSIS — Z7982 Long term (current) use of aspirin: Secondary | ICD-10-CM | POA: Diagnosis not present

## 2016-07-27 DIAGNOSIS — Z452 Encounter for adjustment and management of vascular access device: Secondary | ICD-10-CM | POA: Diagnosis not present

## 2016-07-31 DIAGNOSIS — I5032 Chronic diastolic (congestive) heart failure: Secondary | ICD-10-CM | POA: Diagnosis not present

## 2016-07-31 DIAGNOSIS — L89310 Pressure ulcer of right buttock, unstageable: Secondary | ICD-10-CM | POA: Diagnosis not present

## 2016-07-31 DIAGNOSIS — I119 Hypertensive heart disease without heart failure: Secondary | ICD-10-CM | POA: Diagnosis not present

## 2016-07-31 DIAGNOSIS — N390 Urinary tract infection, site not specified: Secondary | ICD-10-CM | POA: Diagnosis not present

## 2016-07-31 DIAGNOSIS — B9689 Other specified bacterial agents as the cause of diseases classified elsewhere: Secondary | ICD-10-CM | POA: Diagnosis not present

## 2016-07-31 DIAGNOSIS — E1165 Type 2 diabetes mellitus with hyperglycemia: Secondary | ICD-10-CM | POA: Diagnosis not present

## 2016-08-04 DIAGNOSIS — N390 Urinary tract infection, site not specified: Secondary | ICD-10-CM | POA: Diagnosis not present

## 2016-08-04 DIAGNOSIS — E1165 Type 2 diabetes mellitus with hyperglycemia: Secondary | ICD-10-CM | POA: Diagnosis not present

## 2016-08-04 DIAGNOSIS — L89310 Pressure ulcer of right buttock, unstageable: Secondary | ICD-10-CM | POA: Diagnosis not present

## 2016-08-04 DIAGNOSIS — B9689 Other specified bacterial agents as the cause of diseases classified elsewhere: Secondary | ICD-10-CM | POA: Diagnosis not present

## 2016-08-04 DIAGNOSIS — I5032 Chronic diastolic (congestive) heart failure: Secondary | ICD-10-CM | POA: Diagnosis not present

## 2016-08-04 DIAGNOSIS — I119 Hypertensive heart disease without heart failure: Secondary | ICD-10-CM | POA: Diagnosis not present

## 2016-08-06 DIAGNOSIS — N182 Chronic kidney disease, stage 2 (mild): Secondary | ICD-10-CM | POA: Diagnosis not present

## 2016-08-06 DIAGNOSIS — I5032 Chronic diastolic (congestive) heart failure: Secondary | ICD-10-CM | POA: Diagnosis not present

## 2016-08-06 DIAGNOSIS — Z1389 Encounter for screening for other disorder: Secondary | ICD-10-CM | POA: Diagnosis not present

## 2016-08-06 DIAGNOSIS — Z0000 Encounter for general adult medical examination without abnormal findings: Secondary | ICD-10-CM | POA: Diagnosis not present

## 2016-08-06 DIAGNOSIS — N3000 Acute cystitis without hematuria: Secondary | ICD-10-CM | POA: Diagnosis not present

## 2016-08-06 DIAGNOSIS — E1142 Type 2 diabetes mellitus with diabetic polyneuropathy: Secondary | ICD-10-CM | POA: Diagnosis not present

## 2016-08-06 DIAGNOSIS — I10 Essential (primary) hypertension: Secondary | ICD-10-CM | POA: Diagnosis not present

## 2016-08-06 DIAGNOSIS — F0280 Dementia in other diseases classified elsewhere without behavioral disturbance: Secondary | ICD-10-CM | POA: Diagnosis not present

## 2016-08-12 DIAGNOSIS — E1165 Type 2 diabetes mellitus with hyperglycemia: Secondary | ICD-10-CM | POA: Diagnosis not present

## 2016-08-12 DIAGNOSIS — I5032 Chronic diastolic (congestive) heart failure: Secondary | ICD-10-CM | POA: Diagnosis not present

## 2016-08-12 DIAGNOSIS — I119 Hypertensive heart disease without heart failure: Secondary | ICD-10-CM | POA: Diagnosis not present

## 2016-08-12 DIAGNOSIS — B9689 Other specified bacterial agents as the cause of diseases classified elsewhere: Secondary | ICD-10-CM | POA: Diagnosis not present

## 2016-08-12 DIAGNOSIS — L89310 Pressure ulcer of right buttock, unstageable: Secondary | ICD-10-CM | POA: Diagnosis not present

## 2016-08-12 DIAGNOSIS — N390 Urinary tract infection, site not specified: Secondary | ICD-10-CM | POA: Diagnosis not present

## 2016-08-20 DIAGNOSIS — N390 Urinary tract infection, site not specified: Secondary | ICD-10-CM | POA: Diagnosis not present

## 2016-08-20 DIAGNOSIS — B9689 Other specified bacterial agents as the cause of diseases classified elsewhere: Secondary | ICD-10-CM | POA: Diagnosis not present

## 2016-08-20 DIAGNOSIS — E1165 Type 2 diabetes mellitus with hyperglycemia: Secondary | ICD-10-CM | POA: Diagnosis not present

## 2016-08-20 DIAGNOSIS — L89310 Pressure ulcer of right buttock, unstageable: Secondary | ICD-10-CM | POA: Diagnosis not present

## 2016-08-20 DIAGNOSIS — I5032 Chronic diastolic (congestive) heart failure: Secondary | ICD-10-CM | POA: Diagnosis not present

## 2016-08-20 DIAGNOSIS — I119 Hypertensive heart disease without heart failure: Secondary | ICD-10-CM | POA: Diagnosis not present

## 2016-08-26 DIAGNOSIS — I119 Hypertensive heart disease without heart failure: Secondary | ICD-10-CM | POA: Diagnosis not present

## 2016-08-26 DIAGNOSIS — I5032 Chronic diastolic (congestive) heart failure: Secondary | ICD-10-CM | POA: Diagnosis not present

## 2016-08-26 DIAGNOSIS — N390 Urinary tract infection, site not specified: Secondary | ICD-10-CM | POA: Diagnosis not present

## 2016-08-26 DIAGNOSIS — E1165 Type 2 diabetes mellitus with hyperglycemia: Secondary | ICD-10-CM | POA: Diagnosis not present

## 2016-08-26 DIAGNOSIS — L89310 Pressure ulcer of right buttock, unstageable: Secondary | ICD-10-CM | POA: Diagnosis not present

## 2016-08-26 DIAGNOSIS — B9689 Other specified bacterial agents as the cause of diseases classified elsewhere: Secondary | ICD-10-CM | POA: Diagnosis not present

## 2016-09-02 DIAGNOSIS — L89310 Pressure ulcer of right buttock, unstageable: Secondary | ICD-10-CM | POA: Diagnosis not present

## 2016-09-02 DIAGNOSIS — I5032 Chronic diastolic (congestive) heart failure: Secondary | ICD-10-CM | POA: Diagnosis not present

## 2016-09-02 DIAGNOSIS — N390 Urinary tract infection, site not specified: Secondary | ICD-10-CM | POA: Diagnosis not present

## 2016-09-02 DIAGNOSIS — I119 Hypertensive heart disease without heart failure: Secondary | ICD-10-CM | POA: Diagnosis not present

## 2016-09-02 DIAGNOSIS — B9689 Other specified bacterial agents as the cause of diseases classified elsewhere: Secondary | ICD-10-CM | POA: Diagnosis not present

## 2016-09-02 DIAGNOSIS — E1165 Type 2 diabetes mellitus with hyperglycemia: Secondary | ICD-10-CM | POA: Diagnosis not present

## 2016-11-02 DIAGNOSIS — K648 Other hemorrhoids: Secondary | ICD-10-CM | POA: Diagnosis not present

## 2016-11-05 DIAGNOSIS — Z7951 Long term (current) use of inhaled steroids: Secondary | ICD-10-CM | POA: Diagnosis not present

## 2016-11-05 DIAGNOSIS — Z79899 Other long term (current) drug therapy: Secondary | ICD-10-CM | POA: Diagnosis not present

## 2016-11-05 DIAGNOSIS — E860 Dehydration: Secondary | ICD-10-CM | POA: Diagnosis present

## 2016-11-05 DIAGNOSIS — N390 Urinary tract infection, site not specified: Secondary | ICD-10-CM | POA: Diagnosis not present

## 2016-11-05 DIAGNOSIS — M1990 Unspecified osteoarthritis, unspecified site: Secondary | ICD-10-CM | POA: Diagnosis present

## 2016-11-05 DIAGNOSIS — Z23 Encounter for immunization: Secondary | ICD-10-CM | POA: Diagnosis not present

## 2016-11-05 DIAGNOSIS — N178 Other acute kidney failure: Secondary | ICD-10-CM | POA: Diagnosis not present

## 2016-11-05 DIAGNOSIS — R0602 Shortness of breath: Secondary | ICD-10-CM | POA: Diagnosis not present

## 2016-11-05 DIAGNOSIS — N179 Acute kidney failure, unspecified: Secondary | ICD-10-CM | POA: Diagnosis present

## 2016-11-05 DIAGNOSIS — J45909 Unspecified asthma, uncomplicated: Secondary | ICD-10-CM | POA: Diagnosis present

## 2016-11-05 DIAGNOSIS — E1165 Type 2 diabetes mellitus with hyperglycemia: Secondary | ICD-10-CM | POA: Diagnosis not present

## 2016-11-05 DIAGNOSIS — I5032 Chronic diastolic (congestive) heart failure: Secondary | ICD-10-CM | POA: Diagnosis present

## 2016-11-05 DIAGNOSIS — D649 Anemia, unspecified: Secondary | ICD-10-CM | POA: Diagnosis present

## 2016-11-05 DIAGNOSIS — E871 Hypo-osmolality and hyponatremia: Secondary | ICD-10-CM | POA: Diagnosis not present

## 2016-11-05 DIAGNOSIS — Z6841 Body Mass Index (BMI) 40.0 and over, adult: Secondary | ICD-10-CM | POA: Diagnosis not present

## 2016-11-05 DIAGNOSIS — Z794 Long term (current) use of insulin: Secondary | ICD-10-CM | POA: Diagnosis not present

## 2016-11-05 DIAGNOSIS — Z881 Allergy status to other antibiotic agents status: Secondary | ICD-10-CM | POA: Diagnosis not present

## 2016-11-05 DIAGNOSIS — R1031 Right lower quadrant pain: Secondary | ICD-10-CM | POA: Diagnosis not present

## 2016-11-05 DIAGNOSIS — Z886 Allergy status to analgesic agent status: Secondary | ICD-10-CM | POA: Diagnosis not present

## 2016-11-05 DIAGNOSIS — Z7982 Long term (current) use of aspirin: Secondary | ICD-10-CM | POA: Diagnosis not present

## 2016-11-05 DIAGNOSIS — I2510 Atherosclerotic heart disease of native coronary artery without angina pectoris: Secondary | ICD-10-CM | POA: Diagnosis present

## 2016-11-05 DIAGNOSIS — Z79891 Long term (current) use of opiate analgesic: Secondary | ICD-10-CM | POA: Diagnosis not present

## 2016-11-05 DIAGNOSIS — Z880 Allergy status to penicillin: Secondary | ICD-10-CM | POA: Diagnosis not present

## 2016-11-05 DIAGNOSIS — E785 Hyperlipidemia, unspecified: Secondary | ICD-10-CM | POA: Diagnosis present

## 2016-11-05 DIAGNOSIS — E039 Hypothyroidism, unspecified: Secondary | ICD-10-CM | POA: Diagnosis present

## 2016-11-05 DIAGNOSIS — Z96651 Presence of right artificial knee joint: Secondary | ICD-10-CM | POA: Diagnosis present

## 2016-11-05 DIAGNOSIS — R079 Chest pain, unspecified: Secondary | ICD-10-CM | POA: Diagnosis present

## 2016-11-24 DIAGNOSIS — I10 Essential (primary) hypertension: Secondary | ICD-10-CM | POA: Diagnosis not present

## 2016-11-24 DIAGNOSIS — F0280 Dementia in other diseases classified elsewhere without behavioral disturbance: Secondary | ICD-10-CM | POA: Diagnosis not present

## 2016-11-24 DIAGNOSIS — I5032 Chronic diastolic (congestive) heart failure: Secondary | ICD-10-CM | POA: Diagnosis not present

## 2016-11-24 DIAGNOSIS — E1142 Type 2 diabetes mellitus with diabetic polyneuropathy: Secondary | ICD-10-CM | POA: Diagnosis not present

## 2016-11-24 DIAGNOSIS — N182 Chronic kidney disease, stage 2 (mild): Secondary | ICD-10-CM | POA: Diagnosis not present

## 2016-12-08 DIAGNOSIS — Z23 Encounter for immunization: Secondary | ICD-10-CM | POA: Diagnosis not present

## 2016-12-24 DIAGNOSIS — R69 Illness, unspecified: Secondary | ICD-10-CM | POA: Diagnosis not present

## 2016-12-29 DIAGNOSIS — Z7409 Other reduced mobility: Secondary | ICD-10-CM | POA: Diagnosis not present

## 2016-12-29 DIAGNOSIS — K649 Unspecified hemorrhoids: Secondary | ICD-10-CM | POA: Diagnosis not present

## 2016-12-29 DIAGNOSIS — N3941 Urge incontinence: Secondary | ICD-10-CM | POA: Diagnosis not present

## 2017-01-06 DIAGNOSIS — E039 Hypothyroidism, unspecified: Secondary | ICD-10-CM | POA: Diagnosis not present

## 2017-01-06 DIAGNOSIS — E119 Type 2 diabetes mellitus without complications: Secondary | ICD-10-CM | POA: Diagnosis not present

## 2017-01-06 DIAGNOSIS — T1490XA Injury, unspecified, initial encounter: Secondary | ICD-10-CM | POA: Diagnosis not present

## 2017-01-06 DIAGNOSIS — Z6841 Body Mass Index (BMI) 40.0 and over, adult: Secondary | ICD-10-CM | POA: Diagnosis not present

## 2017-01-19 DIAGNOSIS — K219 Gastro-esophageal reflux disease without esophagitis: Secondary | ICD-10-CM | POA: Diagnosis not present

## 2017-01-19 DIAGNOSIS — E039 Hypothyroidism, unspecified: Secondary | ICD-10-CM | POA: Diagnosis not present

## 2017-01-19 DIAGNOSIS — R109 Unspecified abdominal pain: Secondary | ICD-10-CM | POA: Diagnosis not present

## 2017-01-19 DIAGNOSIS — R279 Unspecified lack of coordination: Secondary | ICD-10-CM | POA: Diagnosis not present

## 2017-01-19 DIAGNOSIS — K649 Unspecified hemorrhoids: Secondary | ICD-10-CM | POA: Diagnosis not present

## 2017-01-19 DIAGNOSIS — E119 Type 2 diabetes mellitus without complications: Secondary | ICD-10-CM | POA: Diagnosis not present

## 2017-01-19 DIAGNOSIS — Z794 Long term (current) use of insulin: Secondary | ICD-10-CM | POA: Diagnosis not present

## 2017-01-19 DIAGNOSIS — K6289 Other specified diseases of anus and rectum: Secondary | ICD-10-CM | POA: Diagnosis not present

## 2017-01-19 DIAGNOSIS — R319 Hematuria, unspecified: Secondary | ICD-10-CM | POA: Diagnosis not present

## 2017-01-19 DIAGNOSIS — Z7982 Long term (current) use of aspirin: Secondary | ICD-10-CM | POA: Diagnosis not present

## 2017-01-19 DIAGNOSIS — N390 Urinary tract infection, site not specified: Secondary | ICD-10-CM | POA: Diagnosis not present

## 2017-01-19 DIAGNOSIS — R1032 Left lower quadrant pain: Secondary | ICD-10-CM | POA: Diagnosis not present

## 2017-01-19 DIAGNOSIS — Z7951 Long term (current) use of inhaled steroids: Secondary | ICD-10-CM | POA: Diagnosis not present

## 2017-01-19 DIAGNOSIS — Z79899 Other long term (current) drug therapy: Secondary | ICD-10-CM | POA: Diagnosis not present

## 2017-01-19 DIAGNOSIS — R1031 Right lower quadrant pain: Secondary | ICD-10-CM | POA: Diagnosis not present

## 2017-01-19 DIAGNOSIS — Z743 Need for continuous supervision: Secondary | ICD-10-CM | POA: Diagnosis not present

## 2017-01-19 DIAGNOSIS — I110 Hypertensive heart disease with heart failure: Secondary | ICD-10-CM | POA: Diagnosis not present

## 2017-01-19 DIAGNOSIS — R1011 Right upper quadrant pain: Secondary | ICD-10-CM | POA: Diagnosis not present

## 2017-01-19 DIAGNOSIS — J449 Chronic obstructive pulmonary disease, unspecified: Secondary | ICD-10-CM | POA: Diagnosis not present

## 2017-01-19 DIAGNOSIS — I509 Heart failure, unspecified: Secondary | ICD-10-CM | POA: Diagnosis not present

## 2017-01-26 DIAGNOSIS — Z1231 Encounter for screening mammogram for malignant neoplasm of breast: Secondary | ICD-10-CM | POA: Diagnosis not present

## 2017-02-26 DIAGNOSIS — N952 Postmenopausal atrophic vaginitis: Secondary | ICD-10-CM | POA: Diagnosis not present

## 2017-03-02 DIAGNOSIS — K922 Gastrointestinal hemorrhage, unspecified: Secondary | ICD-10-CM | POA: Diagnosis present

## 2017-03-02 DIAGNOSIS — M1990 Unspecified osteoarthritis, unspecified site: Secondary | ICD-10-CM | POA: Diagnosis present

## 2017-03-02 DIAGNOSIS — D649 Anemia, unspecified: Secondary | ICD-10-CM | POA: Diagnosis not present

## 2017-03-02 DIAGNOSIS — Z794 Long term (current) use of insulin: Secondary | ICD-10-CM | POA: Diagnosis not present

## 2017-03-02 DIAGNOSIS — I2510 Atherosclerotic heart disease of native coronary artery without angina pectoris: Secondary | ICD-10-CM | POA: Diagnosis present

## 2017-03-02 DIAGNOSIS — N12 Tubulo-interstitial nephritis, not specified as acute or chronic: Secondary | ICD-10-CM | POA: Diagnosis not present

## 2017-03-02 DIAGNOSIS — I130 Hypertensive heart and chronic kidney disease with heart failure and stage 1 through stage 4 chronic kidney disease, or unspecified chronic kidney disease: Secondary | ICD-10-CM | POA: Diagnosis not present

## 2017-03-02 DIAGNOSIS — N189 Chronic kidney disease, unspecified: Secondary | ICD-10-CM | POA: Diagnosis present

## 2017-03-02 DIAGNOSIS — N1330 Unspecified hydronephrosis: Secondary | ICD-10-CM | POA: Diagnosis not present

## 2017-03-02 DIAGNOSIS — R109 Unspecified abdominal pain: Secondary | ICD-10-CM | POA: Diagnosis not present

## 2017-03-02 DIAGNOSIS — Z7982 Long term (current) use of aspirin: Secondary | ICD-10-CM | POA: Diagnosis not present

## 2017-03-02 DIAGNOSIS — N939 Abnormal uterine and vaginal bleeding, unspecified: Secondary | ICD-10-CM | POA: Diagnosis not present

## 2017-03-02 DIAGNOSIS — Z886 Allergy status to analgesic agent status: Secondary | ICD-10-CM | POA: Diagnosis not present

## 2017-03-02 DIAGNOSIS — S62511A Displaced fracture of proximal phalanx of right thumb, initial encounter for closed fracture: Secondary | ICD-10-CM | POA: Diagnosis not present

## 2017-03-02 DIAGNOSIS — J45909 Unspecified asthma, uncomplicated: Secondary | ICD-10-CM | POA: Diagnosis present

## 2017-03-02 DIAGNOSIS — S92414A Nondisplaced fracture of proximal phalanx of right great toe, initial encounter for closed fracture: Secondary | ICD-10-CM | POA: Diagnosis not present

## 2017-03-02 DIAGNOSIS — B9620 Unspecified Escherichia coli [E. coli] as the cause of diseases classified elsewhere: Secondary | ICD-10-CM | POA: Diagnosis present

## 2017-03-02 DIAGNOSIS — Z881 Allergy status to other antibiotic agents status: Secondary | ICD-10-CM | POA: Diagnosis not present

## 2017-03-02 DIAGNOSIS — N179 Acute kidney failure, unspecified: Secondary | ICD-10-CM | POA: Diagnosis present

## 2017-03-02 DIAGNOSIS — R079 Chest pain, unspecified: Secondary | ICD-10-CM | POA: Diagnosis not present

## 2017-03-02 DIAGNOSIS — Z880 Allergy status to penicillin: Secondary | ICD-10-CM | POA: Diagnosis not present

## 2017-03-02 DIAGNOSIS — K9289 Other specified diseases of the digestive system: Secondary | ICD-10-CM | POA: Diagnosis not present

## 2017-03-02 DIAGNOSIS — N390 Urinary tract infection, site not specified: Secondary | ICD-10-CM | POA: Diagnosis not present

## 2017-03-02 DIAGNOSIS — I5032 Chronic diastolic (congestive) heart failure: Secondary | ICD-10-CM | POA: Diagnosis present

## 2017-03-02 DIAGNOSIS — R319 Hematuria, unspecified: Secondary | ICD-10-CM | POA: Diagnosis not present

## 2017-03-02 DIAGNOSIS — Z7951 Long term (current) use of inhaled steroids: Secondary | ICD-10-CM | POA: Diagnosis not present

## 2017-03-02 DIAGNOSIS — E1122 Type 2 diabetes mellitus with diabetic chronic kidney disease: Secondary | ICD-10-CM | POA: Diagnosis present

## 2017-03-02 DIAGNOSIS — S92411A Displaced fracture of proximal phalanx of right great toe, initial encounter for closed fracture: Secondary | ICD-10-CM | POA: Diagnosis present

## 2017-03-02 DIAGNOSIS — Z6841 Body Mass Index (BMI) 40.0 and over, adult: Secondary | ICD-10-CM | POA: Diagnosis not present

## 2017-03-02 DIAGNOSIS — D62 Acute posthemorrhagic anemia: Secondary | ICD-10-CM | POA: Diagnosis not present

## 2017-03-02 DIAGNOSIS — N178 Other acute kidney failure: Secondary | ICD-10-CM | POA: Diagnosis not present

## 2017-03-02 DIAGNOSIS — E785 Hyperlipidemia, unspecified: Secondary | ICD-10-CM | POA: Diagnosis present

## 2017-03-02 DIAGNOSIS — E039 Hypothyroidism, unspecified: Secondary | ICD-10-CM | POA: Diagnosis present

## 2017-03-02 DIAGNOSIS — Z780 Asymptomatic menopausal state: Secondary | ICD-10-CM | POA: Diagnosis not present

## 2017-03-12 DIAGNOSIS — Z01419 Encounter for gynecological examination (general) (routine) without abnormal findings: Secondary | ICD-10-CM | POA: Diagnosis not present

## 2017-03-12 DIAGNOSIS — Z1272 Encounter for screening for malignant neoplasm of vagina: Secondary | ICD-10-CM | POA: Diagnosis not present

## 2017-03-12 DIAGNOSIS — Z6841 Body Mass Index (BMI) 40.0 and over, adult: Secondary | ICD-10-CM | POA: Diagnosis not present

## 2017-03-18 DIAGNOSIS — K9289 Other specified diseases of the digestive system: Secondary | ICD-10-CM | POA: Diagnosis not present

## 2017-03-18 DIAGNOSIS — D62 Acute posthemorrhagic anemia: Secondary | ICD-10-CM | POA: Diagnosis not present

## 2017-04-29 DIAGNOSIS — K625 Hemorrhage of anus and rectum: Secondary | ICD-10-CM | POA: Diagnosis not present

## 2017-05-03 DIAGNOSIS — M810 Age-related osteoporosis without current pathological fracture: Secondary | ICD-10-CM | POA: Diagnosis not present

## 2017-05-03 DIAGNOSIS — I509 Heart failure, unspecified: Secondary | ICD-10-CM | POA: Diagnosis not present

## 2017-05-03 DIAGNOSIS — Z794 Long term (current) use of insulin: Secondary | ICD-10-CM | POA: Diagnosis not present

## 2017-05-03 DIAGNOSIS — E039 Hypothyroidism, unspecified: Secondary | ICD-10-CM | POA: Diagnosis not present

## 2017-05-03 DIAGNOSIS — Z7951 Long term (current) use of inhaled steroids: Secondary | ICD-10-CM | POA: Diagnosis not present

## 2017-05-03 DIAGNOSIS — Z7982 Long term (current) use of aspirin: Secondary | ICD-10-CM | POA: Diagnosis not present

## 2017-05-03 DIAGNOSIS — Z9049 Acquired absence of other specified parts of digestive tract: Secondary | ICD-10-CM | POA: Diagnosis not present

## 2017-05-03 DIAGNOSIS — I110 Hypertensive heart disease with heart failure: Secondary | ICD-10-CM | POA: Diagnosis not present

## 2017-05-03 DIAGNOSIS — Z886 Allergy status to analgesic agent status: Secondary | ICD-10-CM | POA: Diagnosis not present

## 2017-05-03 DIAGNOSIS — Z800 Family history of malignant neoplasm of digestive organs: Secondary | ICD-10-CM | POA: Diagnosis not present

## 2017-05-03 DIAGNOSIS — K625 Hemorrhage of anus and rectum: Secondary | ICD-10-CM | POA: Diagnosis not present

## 2017-05-03 DIAGNOSIS — Z841 Family history of disorders of kidney and ureter: Secondary | ICD-10-CM | POA: Diagnosis not present

## 2017-05-03 DIAGNOSIS — E119 Type 2 diabetes mellitus without complications: Secondary | ICD-10-CM | POA: Diagnosis not present

## 2017-05-03 DIAGNOSIS — Z818 Family history of other mental and behavioral disorders: Secondary | ICD-10-CM | POA: Diagnosis not present

## 2017-05-03 DIAGNOSIS — Z79899 Other long term (current) drug therapy: Secondary | ICD-10-CM | POA: Diagnosis not present

## 2017-05-03 DIAGNOSIS — G4730 Sleep apnea, unspecified: Secondary | ICD-10-CM | POA: Diagnosis not present

## 2017-05-03 DIAGNOSIS — K219 Gastro-esophageal reflux disease without esophagitis: Secondary | ICD-10-CM | POA: Diagnosis not present

## 2017-05-03 DIAGNOSIS — Z96653 Presence of artificial knee joint, bilateral: Secondary | ICD-10-CM | POA: Diagnosis not present

## 2017-05-03 DIAGNOSIS — Z880 Allergy status to penicillin: Secondary | ICD-10-CM | POA: Diagnosis not present

## 2017-05-03 DIAGNOSIS — J449 Chronic obstructive pulmonary disease, unspecified: Secondary | ICD-10-CM | POA: Diagnosis not present

## 2017-05-20 DIAGNOSIS — E1142 Type 2 diabetes mellitus with diabetic polyneuropathy: Secondary | ICD-10-CM | POA: Diagnosis not present

## 2017-05-20 DIAGNOSIS — N182 Chronic kidney disease, stage 2 (mild): Secondary | ICD-10-CM | POA: Diagnosis not present

## 2017-05-20 DIAGNOSIS — M545 Low back pain: Secondary | ICD-10-CM | POA: Diagnosis not present

## 2017-05-20 DIAGNOSIS — I10 Essential (primary) hypertension: Secondary | ICD-10-CM | POA: Diagnosis not present

## 2017-05-20 DIAGNOSIS — D62 Acute posthemorrhagic anemia: Secondary | ICD-10-CM | POA: Diagnosis not present

## 2017-05-20 DIAGNOSIS — F0280 Dementia in other diseases classified elsewhere without behavioral disturbance: Secondary | ICD-10-CM | POA: Diagnosis not present

## 2017-05-20 DIAGNOSIS — R5383 Other fatigue: Secondary | ICD-10-CM | POA: Diagnosis not present

## 2017-06-07 DIAGNOSIS — K625 Hemorrhage of anus and rectum: Secondary | ICD-10-CM | POA: Diagnosis not present

## 2017-07-02 DIAGNOSIS — N3080 Other cystitis without hematuria: Secondary | ICD-10-CM | POA: Diagnosis not present

## 2017-07-02 DIAGNOSIS — J208 Acute bronchitis due to other specified organisms: Secondary | ICD-10-CM | POA: Diagnosis not present

## 2017-07-14 DIAGNOSIS — N390 Urinary tract infection, site not specified: Secondary | ICD-10-CM | POA: Diagnosis not present

## 2017-08-17 DIAGNOSIS — E1149 Type 2 diabetes mellitus with other diabetic neurological complication: Secondary | ICD-10-CM | POA: Diagnosis not present

## 2017-08-17 DIAGNOSIS — J440 Chronic obstructive pulmonary disease with acute lower respiratory infection: Secondary | ICD-10-CM | POA: Diagnosis not present

## 2017-08-17 DIAGNOSIS — N3080 Other cystitis without hematuria: Secondary | ICD-10-CM | POA: Diagnosis not present

## 2017-08-17 DIAGNOSIS — N183 Chronic kidney disease, stage 3 (moderate): Secondary | ICD-10-CM | POA: Diagnosis not present

## 2017-08-17 DIAGNOSIS — N182 Chronic kidney disease, stage 2 (mild): Secondary | ICD-10-CM | POA: Diagnosis not present

## 2017-08-17 DIAGNOSIS — I10 Essential (primary) hypertension: Secondary | ICD-10-CM | POA: Diagnosis not present

## 2017-08-17 DIAGNOSIS — Z0000 Encounter for general adult medical examination without abnormal findings: Secondary | ICD-10-CM | POA: Diagnosis not present

## 2017-08-17 DIAGNOSIS — Z1389 Encounter for screening for other disorder: Secondary | ICD-10-CM | POA: Diagnosis not present

## 2017-10-19 DIAGNOSIS — B373 Candidiasis of vulva and vagina: Secondary | ICD-10-CM | POA: Diagnosis not present

## 2017-10-22 DIAGNOSIS — I509 Heart failure, unspecified: Secondary | ICD-10-CM | POA: Diagnosis not present

## 2017-10-22 DIAGNOSIS — S42211A Unspecified displaced fracture of surgical neck of right humerus, initial encounter for closed fracture: Secondary | ICD-10-CM | POA: Diagnosis not present

## 2017-10-22 DIAGNOSIS — E119 Type 2 diabetes mellitus without complications: Secondary | ICD-10-CM | POA: Diagnosis not present

## 2017-10-22 DIAGNOSIS — M810 Age-related osteoporosis without current pathological fracture: Secondary | ICD-10-CM | POA: Diagnosis not present

## 2017-10-22 DIAGNOSIS — M25511 Pain in right shoulder: Secondary | ICD-10-CM | POA: Diagnosis not present

## 2017-10-22 DIAGNOSIS — I110 Hypertensive heart disease with heart failure: Secondary | ICD-10-CM | POA: Diagnosis not present

## 2017-10-22 DIAGNOSIS — W01198A Fall on same level from slipping, tripping and stumbling with subsequent striking against other object, initial encounter: Secondary | ICD-10-CM | POA: Diagnosis not present

## 2017-10-22 DIAGNOSIS — J449 Chronic obstructive pulmonary disease, unspecified: Secondary | ICD-10-CM | POA: Diagnosis not present

## 2017-10-22 DIAGNOSIS — K219 Gastro-esophageal reflux disease without esophagitis: Secondary | ICD-10-CM | POA: Diagnosis not present

## 2017-10-22 DIAGNOSIS — T148XXA Other injury of unspecified body region, initial encounter: Secondary | ICD-10-CM | POA: Diagnosis not present

## 2017-12-02 DIAGNOSIS — M545 Low back pain: Secondary | ICD-10-CM | POA: Diagnosis not present

## 2017-12-02 DIAGNOSIS — Z0000 Encounter for general adult medical examination without abnormal findings: Secondary | ICD-10-CM | POA: Diagnosis not present

## 2017-12-02 DIAGNOSIS — N182 Chronic kidney disease, stage 2 (mild): Secondary | ICD-10-CM | POA: Diagnosis not present

## 2017-12-02 DIAGNOSIS — Z1389 Encounter for screening for other disorder: Secondary | ICD-10-CM | POA: Diagnosis not present

## 2017-12-02 DIAGNOSIS — J440 Chronic obstructive pulmonary disease with acute lower respiratory infection: Secondary | ICD-10-CM | POA: Diagnosis not present

## 2017-12-02 DIAGNOSIS — J208 Acute bronchitis due to other specified organisms: Secondary | ICD-10-CM | POA: Diagnosis not present

## 2017-12-02 DIAGNOSIS — Z6841 Body Mass Index (BMI) 40.0 and over, adult: Secondary | ICD-10-CM | POA: Diagnosis not present

## 2017-12-02 DIAGNOSIS — N3080 Other cystitis without hematuria: Secondary | ICD-10-CM | POA: Diagnosis not present

## 2017-12-02 DIAGNOSIS — E1149 Type 2 diabetes mellitus with other diabetic neurological complication: Secondary | ICD-10-CM | POA: Diagnosis not present

## 2018-02-24 DIAGNOSIS — M545 Low back pain: Secondary | ICD-10-CM | POA: Diagnosis not present

## 2018-02-24 DIAGNOSIS — I10 Essential (primary) hypertension: Secondary | ICD-10-CM | POA: Diagnosis not present

## 2018-02-24 DIAGNOSIS — K648 Other hemorrhoids: Secondary | ICD-10-CM | POA: Diagnosis not present

## 2018-02-24 DIAGNOSIS — Z6841 Body Mass Index (BMI) 40.0 and over, adult: Secondary | ICD-10-CM | POA: Diagnosis not present

## 2018-02-24 DIAGNOSIS — J441 Chronic obstructive pulmonary disease with (acute) exacerbation: Secondary | ICD-10-CM | POA: Diagnosis not present

## 2018-02-24 DIAGNOSIS — J208 Acute bronchitis due to other specified organisms: Secondary | ICD-10-CM | POA: Diagnosis not present

## 2018-02-24 DIAGNOSIS — J440 Chronic obstructive pulmonary disease with acute lower respiratory infection: Secondary | ICD-10-CM | POA: Diagnosis not present

## 2018-02-24 DIAGNOSIS — E1142 Type 2 diabetes mellitus with diabetic polyneuropathy: Secondary | ICD-10-CM | POA: Diagnosis not present

## 2018-03-10 DIAGNOSIS — K920 Hematemesis: Secondary | ICD-10-CM | POA: Diagnosis not present

## 2018-03-23 DIAGNOSIS — E119 Type 2 diabetes mellitus without complications: Secondary | ICD-10-CM | POA: Diagnosis not present

## 2018-03-23 DIAGNOSIS — Z90711 Acquired absence of uterus with remaining cervical stump: Secondary | ICD-10-CM | POA: Diagnosis not present

## 2018-03-23 DIAGNOSIS — I110 Hypertensive heart disease with heart failure: Secondary | ICD-10-CM | POA: Diagnosis not present

## 2018-03-23 DIAGNOSIS — K920 Hematemesis: Secondary | ICD-10-CM | POA: Diagnosis not present

## 2018-03-23 DIAGNOSIS — K317 Polyp of stomach and duodenum: Secondary | ICD-10-CM | POA: Diagnosis not present

## 2018-03-23 DIAGNOSIS — J449 Chronic obstructive pulmonary disease, unspecified: Secondary | ICD-10-CM | POA: Diagnosis not present

## 2018-03-23 DIAGNOSIS — I509 Heart failure, unspecified: Secondary | ICD-10-CM | POA: Diagnosis not present

## 2018-03-23 DIAGNOSIS — G4730 Sleep apnea, unspecified: Secondary | ICD-10-CM | POA: Diagnosis not present

## 2018-03-23 DIAGNOSIS — C162 Malignant neoplasm of body of stomach: Secondary | ICD-10-CM | POA: Diagnosis not present

## 2018-03-23 DIAGNOSIS — Z9049 Acquired absence of other specified parts of digestive tract: Secondary | ICD-10-CM | POA: Diagnosis not present

## 2018-03-23 DIAGNOSIS — M1990 Unspecified osteoarthritis, unspecified site: Secondary | ICD-10-CM | POA: Diagnosis not present

## 2018-03-23 DIAGNOSIS — Z886 Allergy status to analgesic agent status: Secondary | ICD-10-CM | POA: Diagnosis not present

## 2018-03-23 DIAGNOSIS — K219 Gastro-esophageal reflux disease without esophagitis: Secondary | ICD-10-CM | POA: Diagnosis not present

## 2018-03-23 DIAGNOSIS — D3A092 Benign carcinoid tumor of the stomach: Secondary | ICD-10-CM | POA: Diagnosis not present

## 2018-03-23 DIAGNOSIS — Z96653 Presence of artificial knee joint, bilateral: Secondary | ICD-10-CM | POA: Diagnosis not present

## 2018-03-23 DIAGNOSIS — Z880 Allergy status to penicillin: Secondary | ICD-10-CM | POA: Diagnosis not present

## 2018-03-23 DIAGNOSIS — M810 Age-related osteoporosis without current pathological fracture: Secondary | ICD-10-CM | POA: Diagnosis not present

## 2018-03-23 DIAGNOSIS — Z881 Allergy status to other antibiotic agents status: Secondary | ICD-10-CM | POA: Diagnosis not present

## 2018-03-23 DIAGNOSIS — R4702 Dysphasia: Secondary | ICD-10-CM | POA: Diagnosis not present

## 2018-03-31 DIAGNOSIS — D3A092 Benign carcinoid tumor of the stomach: Secondary | ICD-10-CM | POA: Diagnosis not present

## 2018-04-06 DIAGNOSIS — I10 Essential (primary) hypertension: Secondary | ICD-10-CM | POA: Diagnosis not present

## 2018-04-06 DIAGNOSIS — I252 Old myocardial infarction: Secondary | ICD-10-CM | POA: Diagnosis not present

## 2018-04-06 DIAGNOSIS — Z8673 Personal history of transient ischemic attack (TIA), and cerebral infarction without residual deficits: Secondary | ICD-10-CM | POA: Diagnosis not present

## 2018-04-06 DIAGNOSIS — I5022 Chronic systolic (congestive) heart failure: Secondary | ICD-10-CM | POA: Diagnosis not present

## 2018-04-06 DIAGNOSIS — E340 Carcinoid syndrome: Secondary | ICD-10-CM | POA: Diagnosis not present

## 2018-04-06 DIAGNOSIS — Z800 Family history of malignant neoplasm of digestive organs: Secondary | ICD-10-CM | POA: Diagnosis not present

## 2018-04-06 DIAGNOSIS — E785 Hyperlipidemia, unspecified: Secondary | ICD-10-CM | POA: Diagnosis not present

## 2018-04-06 DIAGNOSIS — Z7982 Long term (current) use of aspirin: Secondary | ICD-10-CM | POA: Diagnosis not present

## 2018-04-06 DIAGNOSIS — C166 Malignant neoplasm of greater curvature of stomach, unspecified: Secondary | ICD-10-CM | POA: Diagnosis not present

## 2018-04-06 DIAGNOSIS — Z79899 Other long term (current) drug therapy: Secondary | ICD-10-CM | POA: Diagnosis not present

## 2018-04-06 DIAGNOSIS — D3A098 Benign carcinoid tumors of other sites: Secondary | ICD-10-CM | POA: Diagnosis not present

## 2018-04-06 DIAGNOSIS — J42 Unspecified chronic bronchitis: Secondary | ICD-10-CM | POA: Diagnosis not present

## 2018-04-06 DIAGNOSIS — D500 Iron deficiency anemia secondary to blood loss (chronic): Secondary | ICD-10-CM | POA: Diagnosis not present

## 2018-04-06 DIAGNOSIS — J449 Chronic obstructive pulmonary disease, unspecified: Secondary | ICD-10-CM | POA: Diagnosis not present

## 2018-04-06 DIAGNOSIS — E119 Type 2 diabetes mellitus without complications: Secondary | ICD-10-CM | POA: Diagnosis not present

## 2018-04-06 DIAGNOSIS — E0829 Diabetes mellitus due to underlying condition with other diabetic kidney complication: Secondary | ICD-10-CM | POA: Diagnosis not present

## 2018-04-06 DIAGNOSIS — C7A092 Malignant carcinoid tumor of the stomach: Secondary | ICD-10-CM | POA: Diagnosis not present

## 2018-04-06 DIAGNOSIS — Z794 Long term (current) use of insulin: Secondary | ICD-10-CM | POA: Diagnosis not present

## 2018-04-07 ENCOUNTER — Other Ambulatory Visit (HOSPITAL_COMMUNITY): Payer: Self-pay | Admitting: Oncology

## 2018-04-07 DIAGNOSIS — C166 Malignant neoplasm of greater curvature of stomach, unspecified: Secondary | ICD-10-CM

## 2018-04-07 DIAGNOSIS — D3A098 Benign carcinoid tumors of other sites: Secondary | ICD-10-CM

## 2018-04-08 DIAGNOSIS — N178 Other acute kidney failure: Secondary | ICD-10-CM | POA: Diagnosis not present

## 2018-04-08 DIAGNOSIS — E785 Hyperlipidemia, unspecified: Secondary | ICD-10-CM | POA: Diagnosis not present

## 2018-04-08 DIAGNOSIS — Z79899 Other long term (current) drug therapy: Secondary | ICD-10-CM | POA: Diagnosis not present

## 2018-04-08 DIAGNOSIS — D649 Anemia, unspecified: Secondary | ICD-10-CM | POA: Diagnosis not present

## 2018-04-08 DIAGNOSIS — Z90710 Acquired absence of both cervix and uterus: Secondary | ICD-10-CM | POA: Diagnosis not present

## 2018-04-08 DIAGNOSIS — D3A00 Benign carcinoid tumor of unspecified site: Secondary | ICD-10-CM | POA: Diagnosis not present

## 2018-04-08 DIAGNOSIS — I5030 Unspecified diastolic (congestive) heart failure: Secondary | ICD-10-CM | POA: Diagnosis not present

## 2018-04-08 DIAGNOSIS — Z9981 Dependence on supplemental oxygen: Secondary | ICD-10-CM | POA: Diagnosis not present

## 2018-04-08 DIAGNOSIS — E340 Carcinoid syndrome: Secondary | ICD-10-CM | POA: Diagnosis not present

## 2018-04-08 DIAGNOSIS — J309 Allergic rhinitis, unspecified: Secondary | ICD-10-CM | POA: Diagnosis not present

## 2018-04-08 DIAGNOSIS — M1990 Unspecified osteoarthritis, unspecified site: Secondary | ICD-10-CM | POA: Diagnosis not present

## 2018-04-08 DIAGNOSIS — Z9851 Tubal ligation status: Secondary | ICD-10-CM | POA: Diagnosis not present

## 2018-04-08 DIAGNOSIS — Z96651 Presence of right artificial knee joint: Secondary | ICD-10-CM | POA: Diagnosis not present

## 2018-04-08 DIAGNOSIS — J4520 Mild intermittent asthma, uncomplicated: Secondary | ICD-10-CM | POA: Diagnosis not present

## 2018-04-08 DIAGNOSIS — Z794 Long term (current) use of insulin: Secondary | ICD-10-CM | POA: Diagnosis not present

## 2018-04-08 DIAGNOSIS — G4730 Sleep apnea, unspecified: Secondary | ICD-10-CM | POA: Diagnosis not present

## 2018-04-08 DIAGNOSIS — E039 Hypothyroidism, unspecified: Secondary | ICD-10-CM | POA: Diagnosis not present

## 2018-04-08 DIAGNOSIS — Z841 Family history of disorders of kidney and ureter: Secondary | ICD-10-CM | POA: Diagnosis not present

## 2018-04-08 DIAGNOSIS — J45909 Unspecified asthma, uncomplicated: Secondary | ICD-10-CM | POA: Diagnosis not present

## 2018-04-08 DIAGNOSIS — N179 Acute kidney failure, unspecified: Secondary | ICD-10-CM | POA: Diagnosis not present

## 2018-04-08 DIAGNOSIS — C7A092 Malignant carcinoid tumor of the stomach: Secondary | ICD-10-CM | POA: Diagnosis not present

## 2018-04-08 DIAGNOSIS — D62 Acute posthemorrhagic anemia: Secondary | ICD-10-CM | POA: Diagnosis not present

## 2018-04-08 DIAGNOSIS — I110 Hypertensive heart disease with heart failure: Secondary | ICD-10-CM | POA: Diagnosis not present

## 2018-04-08 DIAGNOSIS — Z9049 Acquired absence of other specified parts of digestive tract: Secondary | ICD-10-CM | POA: Diagnosis not present

## 2018-04-08 DIAGNOSIS — Z7951 Long term (current) use of inhaled steroids: Secondary | ICD-10-CM | POA: Diagnosis not present

## 2018-04-08 DIAGNOSIS — Z6841 Body Mass Index (BMI) 40.0 and over, adult: Secondary | ICD-10-CM | POA: Diagnosis not present

## 2018-04-08 DIAGNOSIS — R531 Weakness: Secondary | ICD-10-CM | POA: Diagnosis not present

## 2018-04-08 DIAGNOSIS — E119 Type 2 diabetes mellitus without complications: Secondary | ICD-10-CM | POA: Diagnosis not present

## 2018-04-08 DIAGNOSIS — Z780 Asymptomatic menopausal state: Secondary | ICD-10-CM | POA: Diagnosis not present

## 2018-04-09 DIAGNOSIS — E340 Carcinoid syndrome: Secondary | ICD-10-CM | POA: Diagnosis not present

## 2018-04-09 DIAGNOSIS — J4520 Mild intermittent asthma, uncomplicated: Secondary | ICD-10-CM | POA: Diagnosis not present

## 2018-04-09 DIAGNOSIS — D62 Acute posthemorrhagic anemia: Secondary | ICD-10-CM | POA: Diagnosis not present

## 2018-04-09 DIAGNOSIS — N178 Other acute kidney failure: Secondary | ICD-10-CM | POA: Diagnosis not present

## 2018-04-09 DIAGNOSIS — C7A092 Malignant carcinoid tumor of the stomach: Secondary | ICD-10-CM | POA: Diagnosis not present

## 2018-04-14 ENCOUNTER — Encounter (HOSPITAL_COMMUNITY)
Admission: RE | Admit: 2018-04-14 | Discharge: 2018-04-14 | Disposition: A | Discharge status: Home / Self Care | Payer: Medicare Other | Source: Ambulatory Visit | Attending: Oncology | Admitting: Oncology

## 2018-04-14 DIAGNOSIS — C166 Malignant neoplasm of greater curvature of stomach, unspecified: Secondary | ICD-10-CM | POA: Insufficient documentation

## 2018-04-14 DIAGNOSIS — C7A092 Malignant carcinoid tumor of the stomach: Secondary | ICD-10-CM | POA: Diagnosis not present

## 2018-04-14 DIAGNOSIS — D3A098 Benign carcinoid tumors of other sites: Secondary | ICD-10-CM | POA: Diagnosis not present

## 2018-04-14 LAB — GLUCOSE, CAPILLARY: Glucose-Capillary: 238 mg/dL — ABNORMAL HIGH (ref 65–99)

## 2018-04-14 MED ORDER — FLUDEOXYGLUCOSE F - 18 (FDG) INJECTION
15.0000 | Freq: Once | INTRAVENOUS | Status: AC | PRN
  Administered 2018-04-14: 15 via INTRAVENOUS

## 2018-04-15 DIAGNOSIS — J449 Chronic obstructive pulmonary disease, unspecified: Secondary | ICD-10-CM | POA: Diagnosis not present

## 2018-04-15 DIAGNOSIS — I252 Old myocardial infarction: Secondary | ICD-10-CM | POA: Diagnosis not present

## 2018-04-15 DIAGNOSIS — I110 Hypertensive heart disease with heart failure: Secondary | ICD-10-CM | POA: Diagnosis not present

## 2018-04-15 DIAGNOSIS — Z79899 Other long term (current) drug therapy: Secondary | ICD-10-CM | POA: Diagnosis not present

## 2018-04-15 DIAGNOSIS — D500 Iron deficiency anemia secondary to blood loss (chronic): Secondary | ICD-10-CM | POA: Diagnosis not present

## 2018-04-15 DIAGNOSIS — E1140 Type 2 diabetes mellitus with diabetic neuropathy, unspecified: Secondary | ICD-10-CM | POA: Diagnosis not present

## 2018-04-15 DIAGNOSIS — Z7982 Long term (current) use of aspirin: Secondary | ICD-10-CM | POA: Diagnosis not present

## 2018-04-15 DIAGNOSIS — Z794 Long term (current) use of insulin: Secondary | ICD-10-CM | POA: Diagnosis not present

## 2018-04-15 DIAGNOSIS — E785 Hyperlipidemia, unspecified: Secondary | ICD-10-CM | POA: Diagnosis not present

## 2018-04-22 DIAGNOSIS — E1140 Type 2 diabetes mellitus with diabetic neuropathy, unspecified: Secondary | ICD-10-CM | POA: Diagnosis not present

## 2018-04-22 DIAGNOSIS — Z880 Allergy status to penicillin: Secondary | ICD-10-CM | POA: Diagnosis not present

## 2018-04-22 DIAGNOSIS — Z8673 Personal history of transient ischemic attack (TIA), and cerebral infarction without residual deficits: Secondary | ICD-10-CM | POA: Diagnosis not present

## 2018-04-22 DIAGNOSIS — Z9181 History of falling: Secondary | ICD-10-CM | POA: Diagnosis not present

## 2018-04-22 DIAGNOSIS — I110 Hypertensive heart disease with heart failure: Secondary | ICD-10-CM | POA: Diagnosis not present

## 2018-04-22 DIAGNOSIS — I509 Heart failure, unspecified: Secondary | ICD-10-CM | POA: Diagnosis not present

## 2018-04-22 DIAGNOSIS — J069 Acute upper respiratory infection, unspecified: Secondary | ICD-10-CM | POA: Diagnosis not present

## 2018-04-22 DIAGNOSIS — Z7982 Long term (current) use of aspirin: Secondary | ICD-10-CM | POA: Diagnosis not present

## 2018-04-22 DIAGNOSIS — I252 Old myocardial infarction: Secondary | ICD-10-CM | POA: Diagnosis not present

## 2018-04-22 DIAGNOSIS — N760 Acute vaginitis: Secondary | ICD-10-CM | POA: Diagnosis not present

## 2018-04-22 DIAGNOSIS — J449 Chronic obstructive pulmonary disease, unspecified: Secondary | ICD-10-CM | POA: Diagnosis not present

## 2018-04-22 DIAGNOSIS — N949 Unspecified condition associated with female genital organs and menstrual cycle: Secondary | ICD-10-CM | POA: Diagnosis not present

## 2018-04-22 DIAGNOSIS — C7982 Secondary malignant neoplasm of genital organs: Secondary | ICD-10-CM | POA: Diagnosis not present

## 2018-04-22 DIAGNOSIS — C562 Malignant neoplasm of left ovary: Secondary | ICD-10-CM | POA: Diagnosis not present

## 2018-04-22 DIAGNOSIS — K922 Gastrointestinal hemorrhage, unspecified: Secondary | ICD-10-CM | POA: Diagnosis not present

## 2018-04-22 DIAGNOSIS — D500 Iron deficiency anemia secondary to blood loss (chronic): Secondary | ICD-10-CM | POA: Diagnosis not present

## 2018-04-22 DIAGNOSIS — D490 Neoplasm of unspecified behavior of digestive system: Secondary | ICD-10-CM | POA: Diagnosis not present

## 2018-04-22 DIAGNOSIS — Z794 Long term (current) use of insulin: Secondary | ICD-10-CM | POA: Diagnosis not present

## 2018-04-22 DIAGNOSIS — Z79899 Other long term (current) drug therapy: Secondary | ICD-10-CM | POA: Diagnosis not present

## 2018-04-22 DIAGNOSIS — R1900 Intra-abdominal and pelvic swelling, mass and lump, unspecified site: Secondary | ICD-10-CM | POA: Diagnosis not present

## 2018-04-22 DIAGNOSIS — Z885 Allergy status to narcotic agent status: Secondary | ICD-10-CM | POA: Diagnosis not present

## 2018-04-22 DIAGNOSIS — I2510 Atherosclerotic heart disease of native coronary artery without angina pectoris: Secondary | ICD-10-CM | POA: Diagnosis not present

## 2018-04-22 DIAGNOSIS — D649 Anemia, unspecified: Secondary | ICD-10-CM | POA: Diagnosis not present

## 2018-04-22 DIAGNOSIS — E1165 Type 2 diabetes mellitus with hyperglycemia: Secondary | ICD-10-CM | POA: Diagnosis not present

## 2018-04-22 DIAGNOSIS — I5022 Chronic systolic (congestive) heart failure: Secondary | ICD-10-CM | POA: Diagnosis not present

## 2018-04-22 DIAGNOSIS — D3A098 Benign carcinoid tumors of other sites: Secondary | ICD-10-CM | POA: Diagnosis not present

## 2018-04-22 DIAGNOSIS — Z9981 Dependence on supplemental oxygen: Secondary | ICD-10-CM | POA: Diagnosis not present

## 2018-04-26 DIAGNOSIS — D500 Iron deficiency anemia secondary to blood loss (chronic): Secondary | ICD-10-CM | POA: Diagnosis not present

## 2018-04-28 DIAGNOSIS — I10 Essential (primary) hypertension: Secondary | ICD-10-CM | POA: Diagnosis not present

## 2018-04-28 DIAGNOSIS — K2940 Chronic atrophic gastritis without bleeding: Secondary | ICD-10-CM | POA: Diagnosis not present

## 2018-04-28 DIAGNOSIS — J449 Chronic obstructive pulmonary disease, unspecified: Secondary | ICD-10-CM | POA: Diagnosis not present

## 2018-04-28 DIAGNOSIS — E119 Type 2 diabetes mellitus without complications: Secondary | ICD-10-CM | POA: Diagnosis not present

## 2018-04-28 DIAGNOSIS — I252 Old myocardial infarction: Secondary | ICD-10-CM | POA: Diagnosis not present

## 2018-04-28 DIAGNOSIS — E785 Hyperlipidemia, unspecified: Secondary | ICD-10-CM | POA: Diagnosis not present

## 2018-04-28 DIAGNOSIS — Z8673 Personal history of transient ischemic attack (TIA), and cerebral infarction without residual deficits: Secondary | ICD-10-CM | POA: Diagnosis not present

## 2018-04-28 DIAGNOSIS — D3A092 Benign carcinoid tumor of the stomach: Secondary | ICD-10-CM | POA: Diagnosis not present

## 2018-04-28 DIAGNOSIS — D3A8 Other benign neuroendocrine tumors: Secondary | ICD-10-CM | POA: Diagnosis not present

## 2018-04-28 DIAGNOSIS — K3189 Other diseases of stomach and duodenum: Secondary | ICD-10-CM | POA: Diagnosis not present

## 2018-05-03 DIAGNOSIS — D3A098 Benign carcinoid tumors of other sites: Secondary | ICD-10-CM | POA: Diagnosis not present

## 2018-05-03 DIAGNOSIS — E1322 Other specified diabetes mellitus with diabetic chronic kidney disease: Secondary | ICD-10-CM | POA: Diagnosis not present

## 2018-05-03 DIAGNOSIS — D500 Iron deficiency anemia secondary to blood loss (chronic): Secondary | ICD-10-CM | POA: Diagnosis not present

## 2018-05-03 DIAGNOSIS — J449 Chronic obstructive pulmonary disease, unspecified: Secondary | ICD-10-CM | POA: Diagnosis not present

## 2018-05-03 DIAGNOSIS — Z90710 Acquired absence of both cervix and uterus: Secondary | ICD-10-CM | POA: Diagnosis not present

## 2018-05-03 DIAGNOSIS — I130 Hypertensive heart and chronic kidney disease with heart failure and stage 1 through stage 4 chronic kidney disease, or unspecified chronic kidney disease: Secondary | ICD-10-CM | POA: Diagnosis not present

## 2018-05-03 DIAGNOSIS — I509 Heart failure, unspecified: Secondary | ICD-10-CM | POA: Diagnosis not present

## 2018-05-03 DIAGNOSIS — N189 Chronic kidney disease, unspecified: Secondary | ICD-10-CM | POA: Diagnosis not present

## 2018-05-03 DIAGNOSIS — E1365 Other specified diabetes mellitus with hyperglycemia: Secondary | ICD-10-CM | POA: Diagnosis not present

## 2018-05-03 DIAGNOSIS — Z8673 Personal history of transient ischemic attack (TIA), and cerebral infarction without residual deficits: Secondary | ICD-10-CM | POA: Diagnosis not present

## 2018-05-09 DIAGNOSIS — B9620 Unspecified Escherichia coli [E. coli] as the cause of diseases classified elsewhere: Secondary | ICD-10-CM | POA: Diagnosis present

## 2018-05-09 DIAGNOSIS — B9629 Other Escherichia coli [E. coli] as the cause of diseases classified elsewhere: Secondary | ICD-10-CM | POA: Diagnosis not present

## 2018-05-09 DIAGNOSIS — Z6841 Body Mass Index (BMI) 40.0 and over, adult: Secondary | ICD-10-CM | POA: Diagnosis not present

## 2018-05-09 DIAGNOSIS — R319 Hematuria, unspecified: Secondary | ICD-10-CM | POA: Diagnosis not present

## 2018-05-09 DIAGNOSIS — Z79899 Other long term (current) drug therapy: Secondary | ICD-10-CM | POA: Diagnosis not present

## 2018-05-09 DIAGNOSIS — Z9181 History of falling: Secondary | ICD-10-CM | POA: Diagnosis not present

## 2018-05-09 DIAGNOSIS — J4540 Moderate persistent asthma, uncomplicated: Secondary | ICD-10-CM | POA: Diagnosis present

## 2018-05-09 DIAGNOSIS — I4891 Unspecified atrial fibrillation: Secondary | ICD-10-CM | POA: Diagnosis not present

## 2018-05-09 DIAGNOSIS — N838 Other noninflammatory disorders of ovary, fallopian tube and broad ligament: Secondary | ICD-10-CM | POA: Diagnosis not present

## 2018-05-09 DIAGNOSIS — E860 Dehydration: Secondary | ICD-10-CM | POA: Diagnosis present

## 2018-05-09 DIAGNOSIS — N390 Urinary tract infection, site not specified: Secondary | ICD-10-CM | POA: Diagnosis not present

## 2018-05-09 DIAGNOSIS — Z794 Long term (current) use of insulin: Secondary | ICD-10-CM | POA: Diagnosis not present

## 2018-05-09 DIAGNOSIS — M25551 Pain in right hip: Secondary | ICD-10-CM | POA: Diagnosis not present

## 2018-05-09 DIAGNOSIS — R52 Pain, unspecified: Secondary | ICD-10-CM | POA: Diagnosis not present

## 2018-05-09 DIAGNOSIS — E161 Other hypoglycemia: Secondary | ICD-10-CM | POA: Diagnosis not present

## 2018-05-09 DIAGNOSIS — E119 Type 2 diabetes mellitus without complications: Secondary | ICD-10-CM | POA: Diagnosis present

## 2018-05-09 DIAGNOSIS — W19XXXA Unspecified fall, initial encounter: Secondary | ICD-10-CM | POA: Diagnosis not present

## 2018-05-09 DIAGNOSIS — E162 Hypoglycemia, unspecified: Secondary | ICD-10-CM | POA: Diagnosis not present

## 2018-05-09 DIAGNOSIS — Z7982 Long term (current) use of aspirin: Secondary | ICD-10-CM | POA: Diagnosis not present

## 2018-05-09 DIAGNOSIS — I2510 Atherosclerotic heart disease of native coronary artery without angina pectoris: Secondary | ICD-10-CM | POA: Diagnosis present

## 2018-05-09 DIAGNOSIS — S79911A Unspecified injury of right hip, initial encounter: Secondary | ICD-10-CM | POA: Diagnosis not present

## 2018-05-09 DIAGNOSIS — Z7951 Long term (current) use of inhaled steroids: Secondary | ICD-10-CM | POA: Diagnosis not present

## 2018-05-09 DIAGNOSIS — E785 Hyperlipidemia, unspecified: Secondary | ICD-10-CM | POA: Diagnosis present

## 2018-05-09 DIAGNOSIS — S3993XA Unspecified injury of pelvis, initial encounter: Secondary | ICD-10-CM | POA: Diagnosis not present

## 2018-05-09 DIAGNOSIS — E039 Hypothyroidism, unspecified: Secondary | ICD-10-CM | POA: Diagnosis present

## 2018-05-09 DIAGNOSIS — C7A098 Malignant carcinoid tumors of other sites: Secondary | ICD-10-CM | POA: Diagnosis not present

## 2018-05-09 DIAGNOSIS — N12 Tubulo-interstitial nephritis, not specified as acute or chronic: Secondary | ICD-10-CM | POA: Diagnosis present

## 2018-05-09 DIAGNOSIS — N83202 Unspecified ovarian cyst, left side: Secondary | ICD-10-CM | POA: Diagnosis not present

## 2018-05-17 DIAGNOSIS — D500 Iron deficiency anemia secondary to blood loss (chronic): Secondary | ICD-10-CM | POA: Diagnosis not present

## 2018-06-01 DIAGNOSIS — K648 Other hemorrhoids: Secondary | ICD-10-CM | POA: Diagnosis not present

## 2018-06-01 DIAGNOSIS — M545 Low back pain: Secondary | ICD-10-CM | POA: Diagnosis not present

## 2018-06-01 DIAGNOSIS — E1142 Type 2 diabetes mellitus with diabetic polyneuropathy: Secondary | ICD-10-CM | POA: Diagnosis not present

## 2018-06-01 DIAGNOSIS — D638 Anemia in other chronic diseases classified elsewhere: Secondary | ICD-10-CM | POA: Diagnosis not present

## 2018-06-01 DIAGNOSIS — J441 Chronic obstructive pulmonary disease with (acute) exacerbation: Secondary | ICD-10-CM | POA: Diagnosis not present

## 2018-06-01 DIAGNOSIS — I10 Essential (primary) hypertension: Secondary | ICD-10-CM | POA: Diagnosis not present

## 2018-06-02 DIAGNOSIS — J449 Chronic obstructive pulmonary disease, unspecified: Secondary | ICD-10-CM | POA: Diagnosis not present

## 2018-06-02 DIAGNOSIS — Z794 Long term (current) use of insulin: Secondary | ICD-10-CM | POA: Diagnosis not present

## 2018-06-02 DIAGNOSIS — I509 Heart failure, unspecified: Secondary | ICD-10-CM | POA: Diagnosis not present

## 2018-06-02 DIAGNOSIS — Z885 Allergy status to narcotic agent status: Secondary | ICD-10-CM | POA: Diagnosis not present

## 2018-06-02 DIAGNOSIS — C7A094 Malignant carcinoid tumor of the foregut NOS: Secondary | ICD-10-CM | POA: Diagnosis not present

## 2018-06-02 DIAGNOSIS — D699 Hemorrhagic condition, unspecified: Secondary | ICD-10-CM | POA: Diagnosis not present

## 2018-06-02 DIAGNOSIS — Z9981 Dependence on supplemental oxygen: Secondary | ICD-10-CM | POA: Diagnosis not present

## 2018-06-02 DIAGNOSIS — N761 Subacute and chronic vaginitis: Secondary | ICD-10-CM | POA: Diagnosis not present

## 2018-06-02 DIAGNOSIS — E1169 Type 2 diabetes mellitus with other specified complication: Secondary | ICD-10-CM | POA: Diagnosis not present

## 2018-06-02 DIAGNOSIS — R0902 Hypoxemia: Secondary | ICD-10-CM | POA: Diagnosis not present

## 2018-06-02 DIAGNOSIS — I2510 Atherosclerotic heart disease of native coronary artery without angina pectoris: Secondary | ICD-10-CM | POA: Diagnosis not present

## 2018-06-02 DIAGNOSIS — Z888 Allergy status to other drugs, medicaments and biological substances status: Secondary | ICD-10-CM | POA: Diagnosis not present

## 2018-06-02 DIAGNOSIS — Z7951 Long term (current) use of inhaled steroids: Secondary | ICD-10-CM | POA: Diagnosis not present

## 2018-06-02 DIAGNOSIS — Z880 Allergy status to penicillin: Secondary | ICD-10-CM | POA: Diagnosis not present

## 2018-06-02 DIAGNOSIS — D3A098 Benign carcinoid tumors of other sites: Secondary | ICD-10-CM | POA: Diagnosis not present

## 2018-06-02 DIAGNOSIS — Z79899 Other long term (current) drug therapy: Secondary | ICD-10-CM | POA: Diagnosis not present

## 2018-06-02 DIAGNOSIS — D500 Iron deficiency anemia secondary to blood loss (chronic): Secondary | ICD-10-CM | POA: Diagnosis not present

## 2018-06-02 DIAGNOSIS — Z9889 Other specified postprocedural states: Secondary | ICD-10-CM | POA: Diagnosis not present

## 2018-06-02 DIAGNOSIS — K589 Irritable bowel syndrome without diarrhea: Secondary | ICD-10-CM | POA: Diagnosis not present

## 2018-06-02 DIAGNOSIS — E1140 Type 2 diabetes mellitus with diabetic neuropathy, unspecified: Secondary | ICD-10-CM | POA: Diagnosis not present

## 2018-06-02 DIAGNOSIS — Z8673 Personal history of transient ischemic attack (TIA), and cerebral infarction without residual deficits: Secondary | ICD-10-CM | POA: Diagnosis not present

## 2018-06-30 DIAGNOSIS — I252 Old myocardial infarction: Secondary | ICD-10-CM | POA: Diagnosis not present

## 2018-06-30 DIAGNOSIS — Z8673 Personal history of transient ischemic attack (TIA), and cerebral infarction without residual deficits: Secondary | ICD-10-CM | POA: Diagnosis not present

## 2018-06-30 DIAGNOSIS — N949 Unspecified condition associated with female genital organs and menstrual cycle: Secondary | ICD-10-CM | POA: Diagnosis not present

## 2018-06-30 DIAGNOSIS — J449 Chronic obstructive pulmonary disease, unspecified: Secondary | ICD-10-CM | POA: Diagnosis not present

## 2018-06-30 DIAGNOSIS — Z9981 Dependence on supplemental oxygen: Secondary | ICD-10-CM | POA: Diagnosis not present

## 2018-06-30 DIAGNOSIS — N189 Chronic kidney disease, unspecified: Secondary | ICD-10-CM | POA: Diagnosis not present

## 2018-06-30 DIAGNOSIS — I2510 Atherosclerotic heart disease of native coronary artery without angina pectoris: Secondary | ICD-10-CM | POA: Diagnosis not present

## 2018-06-30 DIAGNOSIS — N761 Subacute and chronic vaginitis: Secondary | ICD-10-CM | POA: Diagnosis not present

## 2018-06-30 DIAGNOSIS — E785 Hyperlipidemia, unspecified: Secondary | ICD-10-CM | POA: Diagnosis not present

## 2018-06-30 DIAGNOSIS — I130 Hypertensive heart and chronic kidney disease with heart failure and stage 1 through stage 4 chronic kidney disease, or unspecified chronic kidney disease: Secondary | ICD-10-CM | POA: Diagnosis not present

## 2018-06-30 DIAGNOSIS — R0902 Hypoxemia: Secondary | ICD-10-CM | POA: Diagnosis not present

## 2018-06-30 DIAGNOSIS — K589 Irritable bowel syndrome without diarrhea: Secondary | ICD-10-CM | POA: Diagnosis not present

## 2018-06-30 DIAGNOSIS — I509 Heart failure, unspecified: Secondary | ICD-10-CM | POA: Diagnosis not present

## 2018-06-30 DIAGNOSIS — D3A098 Benign carcinoid tumors of other sites: Secondary | ICD-10-CM | POA: Diagnosis not present

## 2018-06-30 DIAGNOSIS — C7A094 Malignant carcinoid tumor of the foregut NOS: Secondary | ICD-10-CM | POA: Diagnosis not present

## 2018-06-30 DIAGNOSIS — C7A092 Malignant carcinoid tumor of the stomach: Secondary | ICD-10-CM | POA: Diagnosis not present

## 2018-06-30 DIAGNOSIS — R978 Other abnormal tumor markers: Secondary | ICD-10-CM | POA: Diagnosis not present

## 2018-06-30 DIAGNOSIS — Z79899 Other long term (current) drug therapy: Secondary | ICD-10-CM | POA: Diagnosis not present

## 2018-06-30 DIAGNOSIS — Z800 Family history of malignant neoplasm of digestive organs: Secondary | ICD-10-CM | POA: Diagnosis not present

## 2018-06-30 DIAGNOSIS — E1140 Type 2 diabetes mellitus with diabetic neuropathy, unspecified: Secondary | ICD-10-CM | POA: Diagnosis not present

## 2018-06-30 DIAGNOSIS — D508 Other iron deficiency anemias: Secondary | ICD-10-CM | POA: Diagnosis not present

## 2018-06-30 DIAGNOSIS — D500 Iron deficiency anemia secondary to blood loss (chronic): Secondary | ICD-10-CM | POA: Diagnosis not present

## 2018-06-30 DIAGNOSIS — Z9181 History of falling: Secondary | ICD-10-CM | POA: Diagnosis not present

## 2018-06-30 DIAGNOSIS — E1122 Type 2 diabetes mellitus with diabetic chronic kidney disease: Secondary | ICD-10-CM | POA: Diagnosis not present

## 2018-07-07 DIAGNOSIS — D500 Iron deficiency anemia secondary to blood loss (chronic): Secondary | ICD-10-CM | POA: Diagnosis not present

## 2018-07-12 DIAGNOSIS — R54 Age-related physical debility: Secondary | ICD-10-CM | POA: Diagnosis not present

## 2018-07-12 DIAGNOSIS — Z8249 Family history of ischemic heart disease and other diseases of the circulatory system: Secondary | ICD-10-CM | POA: Diagnosis not present

## 2018-07-12 DIAGNOSIS — K589 Irritable bowel syndrome without diarrhea: Secondary | ICD-10-CM | POA: Diagnosis not present

## 2018-07-12 DIAGNOSIS — E119 Type 2 diabetes mellitus without complications: Secondary | ICD-10-CM | POA: Diagnosis not present

## 2018-07-12 DIAGNOSIS — Z885 Allergy status to narcotic agent status: Secondary | ICD-10-CM | POA: Diagnosis not present

## 2018-07-12 DIAGNOSIS — N949 Unspecified condition associated with female genital organs and menstrual cycle: Secondary | ICD-10-CM | POA: Diagnosis not present

## 2018-07-12 DIAGNOSIS — R978 Other abnormal tumor markers: Secondary | ICD-10-CM | POA: Diagnosis not present

## 2018-07-12 DIAGNOSIS — Z79899 Other long term (current) drug therapy: Secondary | ICD-10-CM | POA: Diagnosis not present

## 2018-07-12 DIAGNOSIS — J449 Chronic obstructive pulmonary disease, unspecified: Secondary | ICD-10-CM | POA: Diagnosis not present

## 2018-07-12 DIAGNOSIS — R140 Abdominal distension (gaseous): Secondary | ICD-10-CM | POA: Diagnosis not present

## 2018-07-12 DIAGNOSIS — D649 Anemia, unspecified: Secondary | ICD-10-CM | POA: Diagnosis not present

## 2018-07-12 DIAGNOSIS — R1909 Other intra-abdominal and pelvic swelling, mass and lump: Secondary | ICD-10-CM | POA: Diagnosis not present

## 2018-07-12 DIAGNOSIS — Z8673 Personal history of transient ischemic attack (TIA), and cerebral infarction without residual deficits: Secondary | ICD-10-CM | POA: Diagnosis not present

## 2018-07-12 DIAGNOSIS — D508 Other iron deficiency anemias: Secondary | ICD-10-CM | POA: Diagnosis not present

## 2018-07-12 DIAGNOSIS — Z825 Family history of asthma and other chronic lower respiratory diseases: Secondary | ICD-10-CM | POA: Diagnosis not present

## 2018-07-12 DIAGNOSIS — I252 Old myocardial infarction: Secondary | ICD-10-CM | POA: Diagnosis not present

## 2018-07-12 DIAGNOSIS — K922 Gastrointestinal hemorrhage, unspecified: Secondary | ICD-10-CM | POA: Diagnosis not present

## 2018-07-12 DIAGNOSIS — E785 Hyperlipidemia, unspecified: Secondary | ICD-10-CM | POA: Diagnosis not present

## 2018-07-12 DIAGNOSIS — D490 Neoplasm of unspecified behavior of digestive system: Secondary | ICD-10-CM | POA: Diagnosis not present

## 2018-07-12 DIAGNOSIS — Z9981 Dependence on supplemental oxygen: Secondary | ICD-10-CM | POA: Diagnosis not present

## 2018-07-12 DIAGNOSIS — Z880 Allergy status to penicillin: Secondary | ICD-10-CM | POA: Diagnosis not present

## 2018-07-12 DIAGNOSIS — I110 Hypertensive heart disease with heart failure: Secondary | ICD-10-CM | POA: Diagnosis not present

## 2018-07-12 DIAGNOSIS — Z794 Long term (current) use of insulin: Secondary | ICD-10-CM | POA: Diagnosis not present

## 2018-07-12 DIAGNOSIS — D759 Disease of blood and blood-forming organs, unspecified: Secondary | ICD-10-CM | POA: Diagnosis not present

## 2018-07-12 DIAGNOSIS — D3A098 Benign carcinoid tumors of other sites: Secondary | ICD-10-CM | POA: Diagnosis not present

## 2018-07-12 DIAGNOSIS — I509 Heart failure, unspecified: Secondary | ICD-10-CM | POA: Diagnosis not present

## 2018-08-09 DIAGNOSIS — J40 Bronchitis, not specified as acute or chronic: Secondary | ICD-10-CM | POA: Diagnosis not present

## 2018-08-18 DIAGNOSIS — D508 Other iron deficiency anemias: Secondary | ICD-10-CM | POA: Diagnosis not present

## 2018-08-18 DIAGNOSIS — D3A098 Benign carcinoid tumors of other sites: Secondary | ICD-10-CM | POA: Diagnosis not present

## 2018-08-18 DIAGNOSIS — I5022 Chronic systolic (congestive) heart failure: Secondary | ICD-10-CM | POA: Diagnosis not present

## 2018-08-18 DIAGNOSIS — R978 Other abnormal tumor markers: Secondary | ICD-10-CM | POA: Diagnosis not present

## 2018-08-18 DIAGNOSIS — N949 Unspecified condition associated with female genital organs and menstrual cycle: Secondary | ICD-10-CM | POA: Diagnosis not present

## 2018-08-23 DIAGNOSIS — Z1231 Encounter for screening mammogram for malignant neoplasm of breast: Secondary | ICD-10-CM | POA: Diagnosis not present

## 2018-08-29 DIAGNOSIS — R1909 Other intra-abdominal and pelvic swelling, mass and lump: Secondary | ICD-10-CM | POA: Diagnosis not present

## 2018-08-29 DIAGNOSIS — N83291 Other ovarian cyst, right side: Secondary | ICD-10-CM | POA: Diagnosis not present

## 2018-08-29 DIAGNOSIS — D3A098 Benign carcinoid tumors of other sites: Secondary | ICD-10-CM | POA: Diagnosis not present

## 2018-09-02 DIAGNOSIS — Z79899 Other long term (current) drug therapy: Secondary | ICD-10-CM | POA: Diagnosis not present

## 2018-09-02 DIAGNOSIS — Z8673 Personal history of transient ischemic attack (TIA), and cerebral infarction without residual deficits: Secondary | ICD-10-CM | POA: Diagnosis not present

## 2018-09-02 DIAGNOSIS — I2510 Atherosclerotic heart disease of native coronary artery without angina pectoris: Secondary | ICD-10-CM | POA: Diagnosis not present

## 2018-09-02 DIAGNOSIS — R947 Abnormal results of other endocrine function studies: Secondary | ICD-10-CM | POA: Diagnosis not present

## 2018-09-02 DIAGNOSIS — D3A098 Benign carcinoid tumors of other sites: Secondary | ICD-10-CM | POA: Diagnosis not present

## 2018-09-02 DIAGNOSIS — E785 Hyperlipidemia, unspecified: Secondary | ICD-10-CM | POA: Diagnosis not present

## 2018-09-02 DIAGNOSIS — D500 Iron deficiency anemia secondary to blood loss (chronic): Secondary | ICD-10-CM | POA: Diagnosis not present

## 2018-09-02 DIAGNOSIS — E1140 Type 2 diabetes mellitus with diabetic neuropathy, unspecified: Secondary | ICD-10-CM | POA: Diagnosis not present

## 2018-09-02 DIAGNOSIS — J449 Chronic obstructive pulmonary disease, unspecified: Secondary | ICD-10-CM | POA: Diagnosis not present

## 2018-09-02 DIAGNOSIS — Z794 Long term (current) use of insulin: Secondary | ICD-10-CM | POA: Diagnosis not present

## 2018-09-02 DIAGNOSIS — I110 Hypertensive heart disease with heart failure: Secondary | ICD-10-CM | POA: Diagnosis not present

## 2018-09-02 DIAGNOSIS — I509 Heart failure, unspecified: Secondary | ICD-10-CM | POA: Diagnosis not present

## 2018-09-02 IMAGING — CT NM PET TUM IMG INITIAL (PI) SKULL BASE T - THIGH
1 of 7 series · 1 of 25 positions shown · non-contrast
Comparison: CT abdomen 03/02/2017

CLINICAL DATA: Initial treatment strategy for carcinoid tumor of
the stomach..

EXAM:
NUCLEAR MEDICINE PET SKULL BASE TO THIGH
TECHNIQUE: 15.0 mCi F-18 FDG was injected intravenously. Full-ring PET imaging
was performed from the skull base to thigh after the radiotracer. CT
data was obtained and used for attenuation correction and anatomic
localization.
Fasting blood glucose: 238 mg/dl

[Series 3: pet sk_thigh ac · axial · 5.0mm · 4.07mm/px · 1 of 208 slices shown]
[im 125/208]
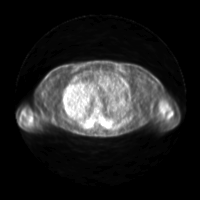

[1 of 25 positions shown; findings below may reference images not displayed]

FINDINGS: Mediastinal blood pool activity: SUV max

NECK: No hypermetabolic lymph nodes in the neck.

Incidental CT findings: none

CHEST: No hypermetabolic mediastinal or hilar nodes. No suspicious
pulmonary nodules on the CT scan.

Incidental CT findings: Cardiac enlargement and extensive coronary
artery calcifications.

ABDOMEN/PELVIS: No abnormal hypermetabolic activity within the
liver, pancreas, adrenal glands, or spleen. No hypermetabolic lymph
nodes in the abdomen or pelvis.

No definite gastric lesion. No enlarged or hypermetabolic mesenteric
lesions. No findings suspicious for hepatic metastatic disease.

Incidental CT findings: 7 cm left adnexal mass increased in size
since the prior CT scan where it measured 4 cm. No hypermetabolism
is identified. It measures 25 Hounsfield units and could be an
enlarging complex cyst. Recommend ultrasound correlation and
follow-up.

There is a small amount of air in the bladder. This could be due to
recent instrumentation but could not exclude a colovesical fistula.
Recommend clinical correlation.

SKELETON: No significant bony findings.

Extensive muscular activity most likely due to the patient taking
insulin and driving the glucose into the muscles.

Incidental CT findings: none
IMPRESSION: 1. No hypermetabolic gastric lesion or evidence for metastatic
disease involving the mesentery or liver. Gallium 68 Dotatate PET-CT
may be helpful for further evaluation if indicated.
2. 7 cm left adnexal mass almost doubled in size since the prior CT
scan from 5901. Recommend ultrasound correlation and surveillance.
3. Air in the bladder could be from recent instrumentation or
colovesical fistula. Recommend correlation with any symptoms of UTI
or pneumaturia.
4. Extensive muscular activity likely due to the patient taking
insulin.

## 2018-09-14 DIAGNOSIS — N9489 Other specified conditions associated with female genital organs and menstrual cycle: Secondary | ICD-10-CM | POA: Diagnosis not present

## 2018-09-14 DIAGNOSIS — D3A098 Benign carcinoid tumors of other sites: Secondary | ICD-10-CM | POA: Diagnosis not present

## 2018-09-14 DIAGNOSIS — I5022 Chronic systolic (congestive) heart failure: Secondary | ICD-10-CM | POA: Diagnosis not present

## 2018-09-20 DIAGNOSIS — I5022 Chronic systolic (congestive) heart failure: Secondary | ICD-10-CM | POA: Diagnosis not present

## 2018-09-22 DIAGNOSIS — D3A098 Benign carcinoid tumors of other sites: Secondary | ICD-10-CM | POA: Diagnosis not present

## 2018-10-05 DIAGNOSIS — E1140 Type 2 diabetes mellitus with diabetic neuropathy, unspecified: Secondary | ICD-10-CM | POA: Diagnosis not present

## 2018-10-05 DIAGNOSIS — Z96653 Presence of artificial knee joint, bilateral: Secondary | ICD-10-CM | POA: Diagnosis not present

## 2018-10-05 DIAGNOSIS — J449 Chronic obstructive pulmonary disease, unspecified: Secondary | ICD-10-CM | POA: Diagnosis not present

## 2018-10-05 DIAGNOSIS — I2510 Atherosclerotic heart disease of native coronary artery without angina pectoris: Secondary | ICD-10-CM | POA: Diagnosis not present

## 2018-10-05 DIAGNOSIS — D3A098 Benign carcinoid tumors of other sites: Secondary | ICD-10-CM | POA: Diagnosis not present

## 2018-10-05 DIAGNOSIS — Z794 Long term (current) use of insulin: Secondary | ICD-10-CM | POA: Diagnosis not present

## 2018-10-05 DIAGNOSIS — I5022 Chronic systolic (congestive) heart failure: Secondary | ICD-10-CM | POA: Diagnosis not present

## 2018-10-05 DIAGNOSIS — R1012 Left upper quadrant pain: Secondary | ICD-10-CM | POA: Diagnosis not present

## 2018-10-05 DIAGNOSIS — N9489 Other specified conditions associated with female genital organs and menstrual cycle: Secondary | ICD-10-CM | POA: Diagnosis not present

## 2018-10-05 DIAGNOSIS — R110 Nausea: Secondary | ICD-10-CM | POA: Diagnosis not present

## 2018-10-05 DIAGNOSIS — I252 Old myocardial infarction: Secondary | ICD-10-CM | POA: Diagnosis not present

## 2018-10-05 DIAGNOSIS — E785 Hyperlipidemia, unspecified: Secondary | ICD-10-CM | POA: Diagnosis not present

## 2018-10-05 DIAGNOSIS — Z23 Encounter for immunization: Secondary | ICD-10-CM | POA: Diagnosis not present

## 2018-10-05 DIAGNOSIS — Z8673 Personal history of transient ischemic attack (TIA), and cerebral infarction without residual deficits: Secondary | ICD-10-CM | POA: Diagnosis not present

## 2018-10-05 DIAGNOSIS — Z9981 Dependence on supplemental oxygen: Secondary | ICD-10-CM | POA: Diagnosis not present

## 2018-10-05 DIAGNOSIS — Z9049 Acquired absence of other specified parts of digestive tract: Secondary | ICD-10-CM | POA: Diagnosis not present

## 2018-10-05 DIAGNOSIS — R947 Abnormal results of other endocrine function studies: Secondary | ICD-10-CM | POA: Diagnosis not present

## 2018-10-05 DIAGNOSIS — I509 Heart failure, unspecified: Secondary | ICD-10-CM | POA: Diagnosis not present

## 2018-10-05 DIAGNOSIS — Z800 Family history of malignant neoplasm of digestive organs: Secondary | ICD-10-CM | POA: Diagnosis not present

## 2018-10-05 DIAGNOSIS — Z9181 History of falling: Secondary | ICD-10-CM | POA: Diagnosis not present

## 2018-10-05 DIAGNOSIS — C562 Malignant neoplasm of left ovary: Secondary | ICD-10-CM | POA: Diagnosis not present

## 2018-10-05 DIAGNOSIS — I110 Hypertensive heart disease with heart failure: Secondary | ICD-10-CM | POA: Diagnosis not present

## 2018-10-05 DIAGNOSIS — C7A092 Malignant carcinoid tumor of the stomach: Secondary | ICD-10-CM | POA: Diagnosis not present

## 2018-10-05 DIAGNOSIS — C7B09 Secondary carcinoid tumors of other sites: Secondary | ICD-10-CM | POA: Diagnosis not present

## 2018-10-05 DIAGNOSIS — I10 Essential (primary) hypertension: Secondary | ICD-10-CM | POA: Diagnosis not present

## 2018-10-05 DIAGNOSIS — K589 Irritable bowel syndrome without diarrhea: Secondary | ICD-10-CM | POA: Diagnosis not present

## 2018-10-05 DIAGNOSIS — N289 Disorder of kidney and ureter, unspecified: Secondary | ICD-10-CM | POA: Diagnosis not present

## 2018-10-05 DIAGNOSIS — N761 Subacute and chronic vaginitis: Secondary | ICD-10-CM | POA: Diagnosis not present

## 2018-10-05 DIAGNOSIS — D500 Iron deficiency anemia secondary to blood loss (chronic): Secondary | ICD-10-CM | POA: Diagnosis not present

## 2018-10-12 DIAGNOSIS — I5032 Chronic diastolic (congestive) heart failure: Secondary | ICD-10-CM | POA: Diagnosis not present

## 2018-10-18 DIAGNOSIS — D500 Iron deficiency anemia secondary to blood loss (chronic): Secondary | ICD-10-CM | POA: Diagnosis not present

## 2018-10-25 DIAGNOSIS — D500 Iron deficiency anemia secondary to blood loss (chronic): Secondary | ICD-10-CM | POA: Diagnosis not present

## 2018-10-25 DIAGNOSIS — D3A098 Benign carcinoid tumors of other sites: Secondary | ICD-10-CM | POA: Diagnosis not present

## 2018-10-25 DIAGNOSIS — R947 Abnormal results of other endocrine function studies: Secondary | ICD-10-CM | POA: Diagnosis not present

## 2018-10-28 DIAGNOSIS — R947 Abnormal results of other endocrine function studies: Secondary | ICD-10-CM | POA: Diagnosis not present

## 2018-10-28 DIAGNOSIS — D3A098 Benign carcinoid tumors of other sites: Secondary | ICD-10-CM | POA: Diagnosis not present

## 2018-10-28 DIAGNOSIS — C562 Malignant neoplasm of left ovary: Secondary | ICD-10-CM | POA: Diagnosis not present

## 2018-10-28 DIAGNOSIS — C7B09 Secondary carcinoid tumors of other sites: Secondary | ICD-10-CM | POA: Diagnosis not present

## 2018-10-28 DIAGNOSIS — N9489 Other specified conditions associated with female genital organs and menstrual cycle: Secondary | ICD-10-CM | POA: Diagnosis not present

## 2018-11-04 DIAGNOSIS — D3A098 Benign carcinoid tumors of other sites: Secondary | ICD-10-CM | POA: Diagnosis not present

## 2018-11-14 DIAGNOSIS — J189 Pneumonia, unspecified organism: Secondary | ICD-10-CM | POA: Diagnosis not present

## 2018-11-14 DIAGNOSIS — N390 Urinary tract infection, site not specified: Secondary | ICD-10-CM | POA: Diagnosis not present

## 2018-11-14 DIAGNOSIS — R0789 Other chest pain: Secondary | ICD-10-CM | POA: Diagnosis not present

## 2018-11-14 DIAGNOSIS — E162 Hypoglycemia, unspecified: Secondary | ICD-10-CM | POA: Diagnosis not present

## 2018-11-14 DIAGNOSIS — E785 Hyperlipidemia, unspecified: Secondary | ICD-10-CM | POA: Diagnosis not present

## 2018-11-14 DIAGNOSIS — B9620 Unspecified Escherichia coli [E. coli] as the cause of diseases classified elsewhere: Secondary | ICD-10-CM | POA: Diagnosis not present

## 2018-11-14 DIAGNOSIS — J440 Chronic obstructive pulmonary disease with acute lower respiratory infection: Secondary | ICD-10-CM | POA: Diagnosis not present

## 2018-11-14 DIAGNOSIS — E11649 Type 2 diabetes mellitus with hypoglycemia without coma: Secondary | ICD-10-CM | POA: Diagnosis not present

## 2018-11-14 DIAGNOSIS — R0600 Dyspnea, unspecified: Secondary | ICD-10-CM | POA: Diagnosis not present

## 2018-11-14 DIAGNOSIS — R079 Chest pain, unspecified: Secondary | ICD-10-CM | POA: Diagnosis not present

## 2018-11-14 DIAGNOSIS — E161 Other hypoglycemia: Secondary | ICD-10-CM | POA: Diagnosis not present

## 2018-11-14 DIAGNOSIS — R0602 Shortness of breath: Secondary | ICD-10-CM | POA: Diagnosis not present

## 2018-11-14 DIAGNOSIS — J1529 Pneumonia due to other staphylococcus: Secondary | ICD-10-CM | POA: Diagnosis not present

## 2018-11-14 DIAGNOSIS — N3000 Acute cystitis without hematuria: Secondary | ICD-10-CM | POA: Diagnosis not present

## 2018-11-15 DIAGNOSIS — Z794 Long term (current) use of insulin: Secondary | ICD-10-CM | POA: Diagnosis not present

## 2018-11-15 DIAGNOSIS — J1529 Pneumonia due to other staphylococcus: Secondary | ICD-10-CM | POA: Diagnosis present

## 2018-11-15 DIAGNOSIS — B9620 Unspecified Escherichia coli [E. coli] as the cause of diseases classified elsewhere: Secondary | ICD-10-CM | POA: Diagnosis present

## 2018-11-15 DIAGNOSIS — E1140 Type 2 diabetes mellitus with diabetic neuropathy, unspecified: Secondary | ICD-10-CM | POA: Diagnosis present

## 2018-11-15 DIAGNOSIS — Z7982 Long term (current) use of aspirin: Secondary | ICD-10-CM | POA: Diagnosis not present

## 2018-11-15 DIAGNOSIS — E1165 Type 2 diabetes mellitus with hyperglycemia: Secondary | ICD-10-CM | POA: Diagnosis present

## 2018-11-15 DIAGNOSIS — E785 Hyperlipidemia, unspecified: Secondary | ICD-10-CM | POA: Diagnosis present

## 2018-11-15 DIAGNOSIS — E039 Hypothyroidism, unspecified: Secondary | ICD-10-CM | POA: Diagnosis present

## 2018-11-15 DIAGNOSIS — Z87440 Personal history of urinary (tract) infections: Secondary | ICD-10-CM | POA: Diagnosis not present

## 2018-11-15 DIAGNOSIS — N390 Urinary tract infection, site not specified: Secondary | ICD-10-CM | POA: Diagnosis present

## 2018-11-15 DIAGNOSIS — Z79899 Other long term (current) drug therapy: Secondary | ICD-10-CM | POA: Diagnosis not present

## 2018-11-15 DIAGNOSIS — J440 Chronic obstructive pulmonary disease with acute lower respiratory infection: Secondary | ICD-10-CM | POA: Diagnosis present

## 2018-11-15 DIAGNOSIS — I509 Heart failure, unspecified: Secondary | ICD-10-CM | POA: Diagnosis present

## 2018-11-15 DIAGNOSIS — Z9981 Dependence on supplemental oxygen: Secondary | ICD-10-CM | POA: Diagnosis not present

## 2018-11-15 DIAGNOSIS — I2510 Atherosclerotic heart disease of native coronary artery without angina pectoris: Secondary | ICD-10-CM | POA: Diagnosis present

## 2018-11-15 DIAGNOSIS — J4540 Moderate persistent asthma, uncomplicated: Secondary | ICD-10-CM | POA: Diagnosis present

## 2018-11-15 DIAGNOSIS — R531 Weakness: Secondary | ICD-10-CM | POA: Diagnosis present

## 2018-11-15 DIAGNOSIS — Z7951 Long term (current) use of inhaled steroids: Secondary | ICD-10-CM | POA: Diagnosis not present

## 2018-11-15 DIAGNOSIS — I110 Hypertensive heart disease with heart failure: Secondary | ICD-10-CM | POA: Diagnosis present

## 2018-11-15 DIAGNOSIS — R0602 Shortness of breath: Secondary | ICD-10-CM | POA: Diagnosis not present

## 2018-11-15 DIAGNOSIS — J189 Pneumonia, unspecified organism: Secondary | ICD-10-CM | POA: Diagnosis not present

## 2018-11-15 DIAGNOSIS — K219 Gastro-esophageal reflux disease without esophagitis: Secondary | ICD-10-CM | POA: Diagnosis present

## 2018-11-25 DIAGNOSIS — Z7951 Long term (current) use of inhaled steroids: Secondary | ICD-10-CM | POA: Diagnosis not present

## 2018-11-25 DIAGNOSIS — R39198 Other difficulties with micturition: Secondary | ICD-10-CM | POA: Diagnosis not present

## 2018-11-25 DIAGNOSIS — Z794 Long term (current) use of insulin: Secondary | ICD-10-CM | POA: Diagnosis not present

## 2018-11-25 DIAGNOSIS — J449 Chronic obstructive pulmonary disease, unspecified: Secondary | ICD-10-CM | POA: Diagnosis not present

## 2018-11-25 DIAGNOSIS — I509 Heart failure, unspecified: Secondary | ICD-10-CM | POA: Diagnosis not present

## 2018-11-25 DIAGNOSIS — R410 Disorientation, unspecified: Secondary | ICD-10-CM | POA: Diagnosis not present

## 2018-11-25 DIAGNOSIS — R109 Unspecified abdominal pain: Secondary | ICD-10-CM | POA: Diagnosis not present

## 2018-11-25 DIAGNOSIS — Z7982 Long term (current) use of aspirin: Secondary | ICD-10-CM | POA: Diagnosis not present

## 2018-11-25 DIAGNOSIS — I10 Essential (primary) hypertension: Secondary | ICD-10-CM | POA: Diagnosis not present

## 2018-11-25 DIAGNOSIS — K219 Gastro-esophageal reflux disease without esophagitis: Secondary | ICD-10-CM | POA: Diagnosis not present

## 2018-11-25 DIAGNOSIS — R404 Transient alteration of awareness: Secondary | ICD-10-CM | POA: Diagnosis not present

## 2018-11-25 DIAGNOSIS — E119 Type 2 diabetes mellitus without complications: Secondary | ICD-10-CM | POA: Diagnosis not present

## 2018-11-25 DIAGNOSIS — Z79899 Other long term (current) drug therapy: Secondary | ICD-10-CM | POA: Diagnosis not present

## 2018-11-25 DIAGNOSIS — R0989 Other specified symptoms and signs involving the circulatory and respiratory systems: Secondary | ICD-10-CM | POA: Diagnosis not present

## 2018-11-25 DIAGNOSIS — R4182 Altered mental status, unspecified: Secondary | ICD-10-CM | POA: Diagnosis not present

## 2018-11-25 DIAGNOSIS — M25572 Pain in left ankle and joints of left foot: Secondary | ICD-10-CM | POA: Diagnosis not present

## 2018-11-25 DIAGNOSIS — E7800 Pure hypercholesterolemia, unspecified: Secondary | ICD-10-CM | POA: Diagnosis not present

## 2018-11-25 DIAGNOSIS — I110 Hypertensive heart disease with heart failure: Secondary | ICD-10-CM | POA: Diagnosis not present

## 2018-11-25 DIAGNOSIS — E039 Hypothyroidism, unspecified: Secondary | ICD-10-CM | POA: Diagnosis not present

## 2018-11-25 DIAGNOSIS — S99912A Unspecified injury of left ankle, initial encounter: Secondary | ICD-10-CM | POA: Diagnosis not present

## 2018-11-26 DIAGNOSIS — R0989 Other specified symptoms and signs involving the circulatory and respiratory systems: Secondary | ICD-10-CM | POA: Diagnosis not present

## 2018-11-26 DIAGNOSIS — S99912A Unspecified injury of left ankle, initial encounter: Secondary | ICD-10-CM | POA: Diagnosis not present

## 2018-11-26 DIAGNOSIS — R4182 Altered mental status, unspecified: Secondary | ICD-10-CM | POA: Diagnosis not present

## 2018-11-26 DIAGNOSIS — M25572 Pain in left ankle and joints of left foot: Secondary | ICD-10-CM | POA: Diagnosis not present

## 2018-11-28 DIAGNOSIS — R531 Weakness: Secondary | ICD-10-CM | POA: Diagnosis not present

## 2018-11-28 DIAGNOSIS — R296 Repeated falls: Secondary | ICD-10-CM | POA: Diagnosis not present

## 2018-11-28 DIAGNOSIS — Z743 Need for continuous supervision: Secondary | ICD-10-CM | POA: Diagnosis not present

## 2018-11-28 DIAGNOSIS — I10 Essential (primary) hypertension: Secondary | ICD-10-CM | POA: Diagnosis not present

## 2018-11-28 DIAGNOSIS — R4182 Altered mental status, unspecified: Secondary | ICD-10-CM | POA: Diagnosis not present

## 2018-11-28 DIAGNOSIS — E039 Hypothyroidism, unspecified: Secondary | ICD-10-CM | POA: Diagnosis not present

## 2018-11-28 DIAGNOSIS — E1165 Type 2 diabetes mellitus with hyperglycemia: Secondary | ICD-10-CM | POA: Diagnosis not present

## 2018-11-28 DIAGNOSIS — J4540 Moderate persistent asthma, uncomplicated: Secondary | ICD-10-CM | POA: Diagnosis not present

## 2018-11-28 DIAGNOSIS — Z79899 Other long term (current) drug therapy: Secondary | ICD-10-CM | POA: Diagnosis not present

## 2018-11-28 DIAGNOSIS — Z1624 Resistance to multiple antibiotics: Secondary | ICD-10-CM | POA: Diagnosis not present

## 2018-11-28 DIAGNOSIS — I5032 Chronic diastolic (congestive) heart failure: Secondary | ICD-10-CM | POA: Diagnosis not present

## 2018-11-28 DIAGNOSIS — F0390 Unspecified dementia without behavioral disturbance: Secondary | ICD-10-CM | POA: Diagnosis not present

## 2018-11-28 DIAGNOSIS — R0989 Other specified symptoms and signs involving the circulatory and respiratory systems: Secondary | ICD-10-CM | POA: Diagnosis not present

## 2018-11-28 DIAGNOSIS — B379 Candidiasis, unspecified: Secondary | ICD-10-CM | POA: Diagnosis not present

## 2018-11-28 DIAGNOSIS — R404 Transient alteration of awareness: Secondary | ICD-10-CM | POA: Diagnosis not present

## 2018-11-28 DIAGNOSIS — M6281 Muscle weakness (generalized): Secondary | ICD-10-CM | POA: Diagnosis not present

## 2018-11-28 DIAGNOSIS — Z7982 Long term (current) use of aspirin: Secondary | ICD-10-CM | POA: Diagnosis not present

## 2018-11-28 DIAGNOSIS — R410 Disorientation, unspecified: Secondary | ICD-10-CM | POA: Diagnosis not present

## 2018-11-28 DIAGNOSIS — J449 Chronic obstructive pulmonary disease, unspecified: Secondary | ICD-10-CM | POA: Diagnosis not present

## 2018-11-28 DIAGNOSIS — N390 Urinary tract infection, site not specified: Secondary | ICD-10-CM | POA: Diagnosis not present

## 2018-11-28 DIAGNOSIS — I110 Hypertensive heart disease with heart failure: Secondary | ICD-10-CM | POA: Diagnosis not present

## 2018-11-28 DIAGNOSIS — R2689 Other abnormalities of gait and mobility: Secondary | ICD-10-CM | POA: Diagnosis not present

## 2018-11-28 DIAGNOSIS — Z794 Long term (current) use of insulin: Secondary | ICD-10-CM | POA: Diagnosis not present

## 2018-11-28 DIAGNOSIS — I509 Heart failure, unspecified: Secondary | ICD-10-CM | POA: Diagnosis not present

## 2018-11-28 DIAGNOSIS — E119 Type 2 diabetes mellitus without complications: Secondary | ICD-10-CM | POA: Diagnosis not present

## 2018-11-28 DIAGNOSIS — E7800 Pure hypercholesterolemia, unspecified: Secondary | ICD-10-CM | POA: Diagnosis not present

## 2018-11-28 DIAGNOSIS — I2510 Atherosclerotic heart disease of native coronary artery without angina pectoris: Secondary | ICD-10-CM | POA: Diagnosis not present

## 2018-11-28 DIAGNOSIS — R41841 Cognitive communication deficit: Secondary | ICD-10-CM | POA: Diagnosis not present

## 2018-11-28 DIAGNOSIS — Z9981 Dependence on supplemental oxygen: Secondary | ICD-10-CM | POA: Diagnosis not present

## 2018-11-29 DIAGNOSIS — E119 Type 2 diabetes mellitus without complications: Secondary | ICD-10-CM | POA: Diagnosis not present

## 2018-11-29 DIAGNOSIS — J4540 Moderate persistent asthma, uncomplicated: Secondary | ICD-10-CM | POA: Diagnosis not present

## 2018-11-29 DIAGNOSIS — R4182 Altered mental status, unspecified: Secondary | ICD-10-CM | POA: Diagnosis not present

## 2018-12-08 ENCOUNTER — Other Ambulatory Visit: Payer: Self-pay | Admitting: *Deleted

## 2018-12-08 NOTE — Patient Outreach (Signed)
[facility] ([facility]) Care Management  12/08/2018  Rachel Nguyen 01/03/1946 [id number]  Onsite visit to [facility]. Met with [name], Case manager. Patient has some confusion. Discharge plan is still up in the air, family wants to take her home with them and not with spouse. Family to come in and work with patient and therapy  Attempted to meet with patient in room, she was very confused. She was lying in bed, when RNCM asked about her family, she said they were "all dead".  Left a [facility] packet for family at bedside.  Plan to monitor patient for [facility] care management needs  E. , MSN, APRN, AGNP-C, CCM  Post Acute Care Coordinator [facility] ([phone]) Business Cell  ([phone]) Toll Free Office  

## 2018-12-20 ENCOUNTER — Other Ambulatory Visit: Payer: Self-pay | Admitting: *Deleted

## 2018-12-20 NOTE — Patient Outreach (Signed)
[facility]) Care Management  12/20/2018  Rachel Nguyen 05/01/1946 [id number]   Onsite visit to [facility]. Met with patient, spouse, and a grand daughter in room.  Patient was up in her w/c. She was not oriented to place or time.  Grand daughter Donella Stade, reports that she thinks that the patient will go home with her daughter [place] [phone].  Plan to follow along for any [facility] care management needs, will continue to monitor and will collaborate with [facility].  Royetta Crochet. Laymond Purser, MSN, RN, [employer] , [place] [phone]) Business Cell  [phone]) Toll Free Office

## 2019-01-04 ENCOUNTER — Other Ambulatory Visit: Payer: Self-pay | Admitting: *Deleted

## 2019-01-04 NOTE — Patient Outreach (Signed)
[facility]) Care Management  01/04/2019  Rachel Nguyen 04/11/1946 [id number]   Notified by [facility] that patient will remain at facility under LTC, no plans to discharge home. No [facility] CM needs identified. Plan to sign off. Royetta Crochet. Laymond Purser, MSN, RN, [employer] , [place] [phone]) Business Cell  [phone]) Toll Free Office

## 2019-02-03 DIAGNOSIS — E119 Type 2 diabetes mellitus without complications: Secondary | ICD-10-CM | POA: Diagnosis not present

## 2019-02-03 DIAGNOSIS — J4540 Moderate persistent asthma, uncomplicated: Secondary | ICD-10-CM | POA: Diagnosis not present

## 2019-02-03 DIAGNOSIS — R4182 Altered mental status, unspecified: Secondary | ICD-10-CM | POA: Diagnosis not present

## 2019-03-05 DIAGNOSIS — J4540 Moderate persistent asthma, uncomplicated: Secondary | ICD-10-CM | POA: Diagnosis not present

## 2019-03-05 DIAGNOSIS — E119 Type 2 diabetes mellitus without complications: Secondary | ICD-10-CM | POA: Diagnosis not present

## 2019-03-05 DIAGNOSIS — R4182 Altered mental status, unspecified: Secondary | ICD-10-CM | POA: Diagnosis not present

## 2019-03-15 DIAGNOSIS — Z79899 Other long term (current) drug therapy: Secondary | ICD-10-CM | POA: Diagnosis not present

## 2019-03-15 DIAGNOSIS — Z794 Long term (current) use of insulin: Secondary | ICD-10-CM | POA: Diagnosis not present

## 2019-03-15 DIAGNOSIS — Z5181 Encounter for therapeutic drug level monitoring: Secondary | ICD-10-CM | POA: Diagnosis not present

## 2019-04-07 DIAGNOSIS — E119 Type 2 diabetes mellitus without complications: Secondary | ICD-10-CM | POA: Diagnosis not present

## 2019-04-07 DIAGNOSIS — R4182 Altered mental status, unspecified: Secondary | ICD-10-CM | POA: Diagnosis not present

## 2019-04-07 DIAGNOSIS — J4540 Moderate persistent asthma, uncomplicated: Secondary | ICD-10-CM | POA: Diagnosis not present

## 2019-05-10 DIAGNOSIS — J4540 Moderate persistent asthma, uncomplicated: Secondary | ICD-10-CM | POA: Diagnosis not present

## 2019-05-10 DIAGNOSIS — R4182 Altered mental status, unspecified: Secondary | ICD-10-CM | POA: Diagnosis not present

## 2019-05-10 DIAGNOSIS — E119 Type 2 diabetes mellitus without complications: Secondary | ICD-10-CM | POA: Diagnosis not present

## 2019-05-15 DIAGNOSIS — Z5181 Encounter for therapeutic drug level monitoring: Secondary | ICD-10-CM | POA: Diagnosis not present

## 2019-05-15 DIAGNOSIS — E039 Hypothyroidism, unspecified: Secondary | ICD-10-CM | POA: Diagnosis not present

## 2019-05-15 DIAGNOSIS — Z79899 Other long term (current) drug therapy: Secondary | ICD-10-CM | POA: Diagnosis not present

## 2019-06-09 DIAGNOSIS — E119 Type 2 diabetes mellitus without complications: Secondary | ICD-10-CM | POA: Diagnosis not present

## 2019-06-09 DIAGNOSIS — R4182 Altered mental status, unspecified: Secondary | ICD-10-CM | POA: Diagnosis not present

## 2019-06-09 DIAGNOSIS — J4540 Moderate persistent asthma, uncomplicated: Secondary | ICD-10-CM | POA: Diagnosis not present

## 2019-06-26 ENCOUNTER — Other Ambulatory Visit: Payer: Self-pay

## 2019-07-13 DIAGNOSIS — R4182 Altered mental status, unspecified: Secondary | ICD-10-CM | POA: Diagnosis not present

## 2019-07-13 DIAGNOSIS — J4540 Moderate persistent asthma, uncomplicated: Secondary | ICD-10-CM | POA: Diagnosis not present

## 2019-07-13 DIAGNOSIS — E119 Type 2 diabetes mellitus without complications: Secondary | ICD-10-CM | POA: Diagnosis not present

## 2019-08-02 DIAGNOSIS — Z20828 Contact with and (suspected) exposure to other viral communicable diseases: Secondary | ICD-10-CM | POA: Diagnosis not present

## 2019-08-09 DIAGNOSIS — Z20828 Contact with and (suspected) exposure to other viral communicable diseases: Secondary | ICD-10-CM | POA: Diagnosis not present

## 2019-08-13 DIAGNOSIS — J4540 Moderate persistent asthma, uncomplicated: Secondary | ICD-10-CM | POA: Diagnosis not present

## 2019-08-13 DIAGNOSIS — E119 Type 2 diabetes mellitus without complications: Secondary | ICD-10-CM | POA: Diagnosis not present

## 2019-08-13 DIAGNOSIS — R4182 Altered mental status, unspecified: Secondary | ICD-10-CM | POA: Diagnosis not present

## 2019-08-24 DEATH — deceased
